# Patient Record
Sex: Female | Born: 1980
Health system: Southern US, Community
[De-identification: ages and names within clinical notes are randomized; demographics above are authoritative.]

## PROBLEM LIST (undated history)

## (undated) ENCOUNTER — Inpatient Hospital Stay (HOSPITAL_COMMUNITY): Payer: Self-pay

## (undated) DIAGNOSIS — Z5189 Encounter for other specified aftercare: Secondary | ICD-10-CM

## (undated) DIAGNOSIS — IMO0001 Reserved for inherently not codable concepts without codable children: Secondary | ICD-10-CM

## (undated) DIAGNOSIS — K219 Gastro-esophageal reflux disease without esophagitis: Secondary | ICD-10-CM

## (undated) DIAGNOSIS — R760 Raised antibody titer: Secondary | ICD-10-CM

## (undated) DIAGNOSIS — K5090 Crohn's disease, unspecified, without complications: Secondary | ICD-10-CM

## (undated) DIAGNOSIS — N200 Calculus of kidney: Secondary | ICD-10-CM

## (undated) HISTORY — PX: NASAL ENDOSCOPY: SHX286

## (undated) HISTORY — PX: COLONOSCOPY: SHX174

## (undated) HISTORY — PX: WISDOM TOOTH EXTRACTION: SHX21

---

## 1999-05-25 ENCOUNTER — Emergency Department (HOSPITAL_COMMUNITY): Admission: EM | Admit: 1999-05-25 | Discharge: 1999-05-25 | Payer: Self-pay | Admitting: Emergency Medicine

## 1999-06-15 ENCOUNTER — Emergency Department (HOSPITAL_COMMUNITY): Admission: EM | Admit: 1999-06-15 | Discharge: 1999-06-15 | Payer: Self-pay | Admitting: Emergency Medicine

## 2000-06-03 ENCOUNTER — Emergency Department (HOSPITAL_COMMUNITY): Admission: EM | Admit: 2000-06-03 | Discharge: 2000-06-03 | Payer: Self-pay

## 2000-06-18 ENCOUNTER — Emergency Department (HOSPITAL_COMMUNITY): Admission: EM | Admit: 2000-06-18 | Discharge: 2000-06-18 | Payer: Self-pay | Admitting: Emergency Medicine

## 2001-03-04 ENCOUNTER — Emergency Department (HOSPITAL_COMMUNITY): Admission: EM | Admit: 2001-03-04 | Discharge: 2001-03-04 | Payer: Self-pay | Admitting: Emergency Medicine

## 2001-04-12 ENCOUNTER — Emergency Department (HOSPITAL_COMMUNITY): Admission: EM | Admit: 2001-04-12 | Discharge: 2001-04-12 | Payer: Self-pay | Admitting: Emergency Medicine

## 2001-11-04 ENCOUNTER — Inpatient Hospital Stay (HOSPITAL_COMMUNITY): Admission: AD | Admit: 2001-11-04 | Discharge: 2001-11-04 | Payer: Self-pay | Admitting: Obstetrics

## 2001-11-08 ENCOUNTER — Inpatient Hospital Stay (HOSPITAL_COMMUNITY): Admission: AD | Admit: 2001-11-08 | Discharge: 2001-11-08 | Payer: Self-pay | Admitting: Obstetrics

## 2001-11-15 ENCOUNTER — Inpatient Hospital Stay (HOSPITAL_COMMUNITY): Admission: AD | Admit: 2001-11-15 | Discharge: 2001-11-15 | Payer: Self-pay | Admitting: Obstetrics

## 2001-11-16 ENCOUNTER — Inpatient Hospital Stay (HOSPITAL_COMMUNITY): Admission: AD | Admit: 2001-11-16 | Discharge: 2001-11-20 | Payer: Self-pay | Admitting: Obstetrics

## 2002-08-18 ENCOUNTER — Emergency Department (HOSPITAL_COMMUNITY): Admission: EM | Admit: 2002-08-18 | Discharge: 2002-08-18 | Payer: Self-pay | Admitting: Emergency Medicine

## 2002-08-18 ENCOUNTER — Encounter: Payer: Self-pay | Admitting: Emergency Medicine

## 2002-08-20 ENCOUNTER — Ambulatory Visit (HOSPITAL_COMMUNITY): Admission: RE | Admit: 2002-08-20 | Discharge: 2002-08-20 | Payer: Self-pay | Admitting: Gastroenterology

## 2002-10-21 ENCOUNTER — Encounter: Payer: Self-pay | Admitting: Gastroenterology

## 2002-10-22 ENCOUNTER — Inpatient Hospital Stay (HOSPITAL_COMMUNITY): Admission: EM | Admit: 2002-10-22 | Discharge: 2002-10-27 | Payer: Self-pay | Admitting: Gastroenterology

## 2002-10-28 ENCOUNTER — Encounter: Payer: Self-pay | Admitting: Emergency Medicine

## 2002-10-28 ENCOUNTER — Emergency Department (HOSPITAL_COMMUNITY): Admission: EM | Admit: 2002-10-28 | Discharge: 2002-10-28 | Payer: Self-pay | Admitting: Emergency Medicine

## 2002-11-02 ENCOUNTER — Encounter (HOSPITAL_COMMUNITY): Admission: RE | Admit: 2002-11-02 | Discharge: 2002-11-02 | Payer: Self-pay | Admitting: Gastroenterology

## 2003-01-05 ENCOUNTER — Ambulatory Visit (HOSPITAL_COMMUNITY): Admission: RE | Admit: 2003-01-05 | Discharge: 2003-01-05 | Payer: Self-pay | Admitting: Gastroenterology

## 2003-12-18 HISTORY — PX: LITHOTRIPSY: SUR834

## 2004-06-29 ENCOUNTER — Emergency Department (HOSPITAL_COMMUNITY): Admission: EM | Admit: 2004-06-29 | Discharge: 2004-06-29 | Payer: Self-pay | Admitting: Emergency Medicine

## 2004-06-29 ENCOUNTER — Emergency Department (HOSPITAL_COMMUNITY): Admission: EM | Admit: 2004-06-29 | Discharge: 2004-06-30 | Payer: Self-pay | Admitting: Emergency Medicine

## 2004-08-24 ENCOUNTER — Emergency Department (HOSPITAL_COMMUNITY): Admission: EM | Admit: 2004-08-24 | Discharge: 2004-08-24 | Payer: Self-pay

## 2005-01-01 IMAGING — CR DG CHEST 2V
2 series · 2 of 2 positions shown · non-contrast
Comparison: none

CLINICAL DATA: Asthma.  Shortness of breath. Wheezing.
 TWO VIEW CHEST RADIOGRAPH 
 Comparing 10/28/02.

[view not recorded (1 of 2)]
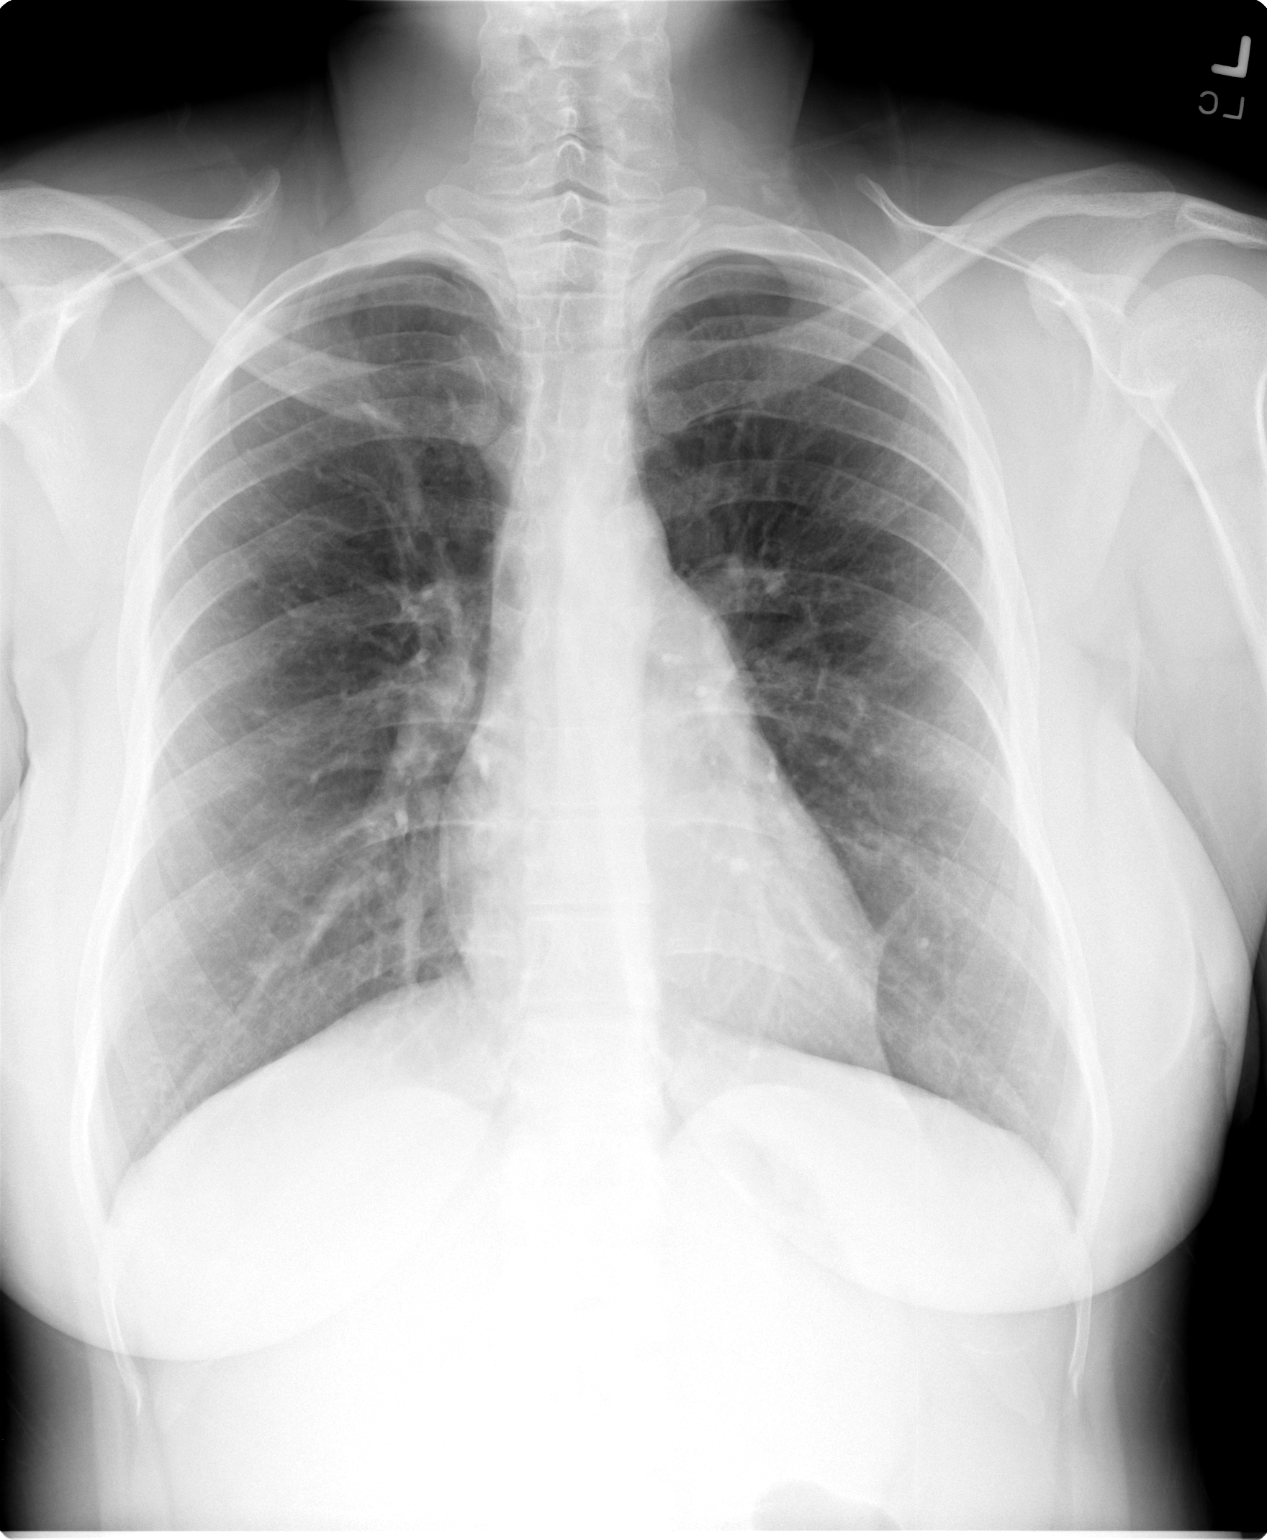

[view not recorded (2 of 2)]
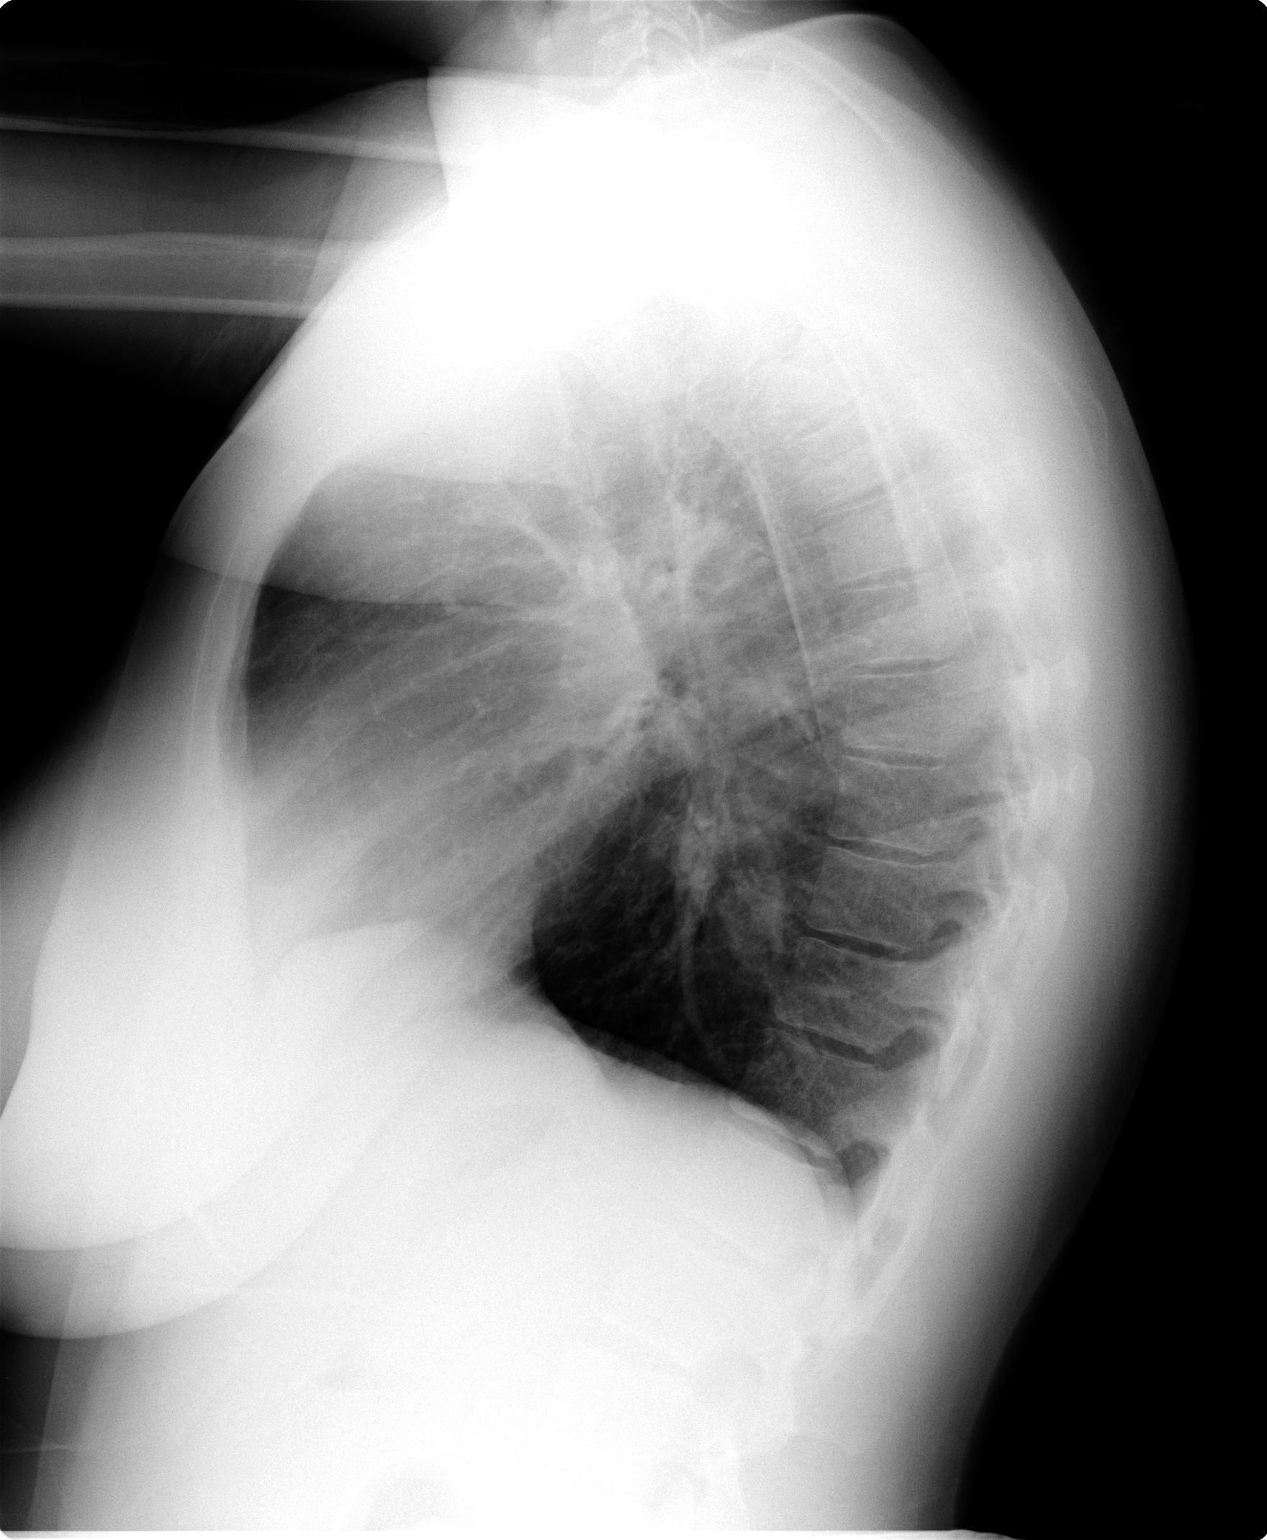

[2 of 2 positions shown; findings below may reference images not displayed]

FINDINGS: The heart size and mediastinal contours are normal. The lungs are clear. The visualized skeleton is unremarkable.

 IMPRESSION
 No active disease.

## 2006-04-18 ENCOUNTER — Inpatient Hospital Stay (HOSPITAL_COMMUNITY): Admission: RE | Admit: 2006-04-18 | Discharge: 2006-04-21 | Payer: Self-pay | Admitting: Obstetrics & Gynecology

## 2006-06-01 ENCOUNTER — Inpatient Hospital Stay (HOSPITAL_COMMUNITY): Admission: EM | Admit: 2006-06-01 | Discharge: 2006-06-02 | Payer: Self-pay | Admitting: Emergency Medicine

## 2007-03-19 ENCOUNTER — Emergency Department (HOSPITAL_COMMUNITY): Admission: EM | Admit: 2007-03-19 | Discharge: 2007-03-20 | Payer: Self-pay | Admitting: Emergency Medicine

## 2011-03-29 ENCOUNTER — Inpatient Hospital Stay (HOSPITAL_COMMUNITY)
Admission: AD | Admit: 2011-03-29 | Discharge: 2011-03-30 | Disposition: A | Discharge status: Home / Self Care | Payer: Medicaid Other | Source: Ambulatory Visit | Attending: Obstetrics & Gynecology | Admitting: Obstetrics & Gynecology

## 2011-03-29 DIAGNOSIS — O209 Hemorrhage in early pregnancy, unspecified: Secondary | ICD-10-CM

## 2011-03-30 ENCOUNTER — Inpatient Hospital Stay (HOSPITAL_COMMUNITY): Payer: Medicaid Other

## 2011-03-30 LAB — WET PREP, GENITAL: Yeast Wet Prep HPF POC: NONE SEEN

## 2011-03-30 LAB — CBC
Hemoglobin: 12 g/dL (ref 12.0–15.0)
MCH: 30.6 pg (ref 26.0–34.0)
RBC: 3.92 MIL/uL (ref 3.87–5.11)

## 2011-03-30 LAB — ABO/RH: ABO/RH(D): O POS

## 2011-03-30 LAB — URINALYSIS, ROUTINE W REFLEX MICROSCOPIC
Protein, ur: NEGATIVE mg/dL
Urobilinogen, UA: 0.2 mg/dL (ref 0.0–1.0)

## 2011-03-30 LAB — POCT PREGNANCY, URINE

## 2011-03-30 LAB — URINE MICROSCOPIC-ADD ON

## 2011-05-03 ENCOUNTER — Inpatient Hospital Stay (HOSPITAL_COMMUNITY)
Admission: AD | Admit: 2011-05-03 | Discharge: 2011-05-04 | Disposition: A | Discharge status: Home / Self Care | Payer: Self-pay | Source: Ambulatory Visit | Attending: Obstetrics & Gynecology | Admitting: Obstetrics & Gynecology

## 2011-05-03 DIAGNOSIS — J45909 Unspecified asthma, uncomplicated: Secondary | ICD-10-CM | POA: Insufficient documentation

## 2011-05-03 DIAGNOSIS — O99891 Other specified diseases and conditions complicating pregnancy: Secondary | ICD-10-CM | POA: Insufficient documentation

## 2011-05-03 DIAGNOSIS — O9989 Other specified diseases and conditions complicating pregnancy, childbirth and the puerperium: Secondary | ICD-10-CM

## 2011-06-01 ENCOUNTER — Emergency Department (HOSPITAL_COMMUNITY)
Admission: EM | Admit: 2011-06-01 | Discharge: 2011-06-01 | Disposition: A | Discharge status: Home / Self Care | Payer: Medicaid Other | Attending: Emergency Medicine | Admitting: Emergency Medicine

## 2011-06-01 DIAGNOSIS — K029 Dental caries, unspecified: Secondary | ICD-10-CM | POA: Insufficient documentation

## 2011-06-01 DIAGNOSIS — O99891 Other specified diseases and conditions complicating pregnancy: Secondary | ICD-10-CM | POA: Insufficient documentation

## 2011-06-01 DIAGNOSIS — K089 Disorder of teeth and supporting structures, unspecified: Secondary | ICD-10-CM | POA: Insufficient documentation

## 2011-06-12 LAB — HEPATITIS B SURFACE ANTIGEN: Hepatitis B Surface Ag: NEGATIVE

## 2011-06-12 LAB — RUBELLA ANTIBODY, IGM: Rubella: IMMUNE

## 2011-07-23 LAB — RPR: RPR: NONREACTIVE

## 2011-09-24 LAB — STREP B DNA PROBE: GBS: NEGATIVE

## 2011-10-04 ENCOUNTER — Other Ambulatory Visit: Payer: Self-pay | Admitting: Obstetrics & Gynecology

## 2011-10-12 NOTE — Patient Instructions (Addendum)
   Your procedure is scheduled on: Friday November 2nd  Enter through the Main Entrance of [facility] at:1:30pm Pick up the phone at the desk and dial [phone] and inform us of your arrival.  Please call this number if you have any problems the morning of surgery: [phone]  Remember: Do not eat food after midnight:Thursday Do not drink clear liquids after Friday at 11 am Take these medicines the morning of surgery with a SIP OF WATER: bring inhaler with you day of surgery, asacol  Do not wear jewelry, make-up, or FINGER nail polish Do not wear lotions, powders, or perfumes.  You may not wear deodorant. Do not shave 48 hours prior to surgery. Do not bring valuables to the hospital.  Leave suitcase in the car. After Surgery it may be brought to your room. For patients being admitted to the hospital, checkout time is 11:00am the day of discharge.    Remember to use your hibiclens as instructed.Please shower with 1/2 bottle the evening before your surgery and the other 1/2 bottle the morning of surgery.

## 2011-10-15 ENCOUNTER — Encounter (HOSPITAL_COMMUNITY): Payer: Self-pay

## 2011-10-16 ENCOUNTER — Encounter (HOSPITAL_COMMUNITY)
Admission: RE | Admit: 2011-10-16 | Discharge: 2011-10-16 | Disposition: A | Discharge status: Home / Self Care | Payer: Medicaid Other | Source: Ambulatory Visit | Attending: Obstetrics & Gynecology | Admitting: Obstetrics & Gynecology

## 2011-10-16 ENCOUNTER — Encounter (HOSPITAL_COMMUNITY): Payer: Self-pay

## 2011-10-16 HISTORY — DX: Gastro-esophageal reflux disease without esophagitis: K21.9

## 2011-10-16 HISTORY — DX: Encounter for other specified aftercare: Z51.89

## 2011-10-16 HISTORY — DX: Raised antibody titer: R76.0

## 2011-10-16 HISTORY — DX: Crohn's disease, unspecified, without complications: K50.90

## 2011-10-16 HISTORY — DX: Reserved for inherently not codable concepts without codable children: IMO0001

## 2011-10-16 LAB — CBC
Platelets: 130 10*3/uL — ABNORMAL LOW (ref 150–400)
RDW: 13.6 % (ref 11.5–15.5)
WBC: 11.6 10*3/uL — ABNORMAL HIGH (ref 4.0–10.5)

## 2011-10-16 LAB — TYPE AND SCREEN
ABO/RH(D): O POS
Antibody Screen: POSITIVE
PT AG Type: NEGATIVE

## 2011-10-16 NOTE — Pre-Procedure Instructions (Signed)
Platelets 130 resulted at pat visit 10/16/11-Dr Hatchett and Dr Malen Gauze aware -no orders

## 2011-10-16 NOTE — Pre-Procedure Instructions (Signed)
Dr Arby Barrette made aware of Antibody positive kell, type and screen today at pat visit Type and screen stat [facility]

## 2011-10-17 ENCOUNTER — Other Ambulatory Visit (HOSPITAL_COMMUNITY): Payer: Medicaid Other

## 2011-10-18 MED ORDER — CEFAZOLIN SODIUM-DEXTROSE 2-3 GM-% IV SOLR
2.0000 g | INTRAVENOUS | Status: AC
  Administered 2011-10-19: 2 g via INTRAVENOUS
  Filled 2011-10-18: qty 50

## 2011-10-19 ENCOUNTER — Encounter (HOSPITAL_COMMUNITY): Payer: Self-pay | Admitting: Anesthesiology

## 2011-10-19 ENCOUNTER — Encounter (HOSPITAL_COMMUNITY): Payer: Self-pay | Admitting: Obstetrics & Gynecology

## 2011-10-19 ENCOUNTER — Encounter (HOSPITAL_COMMUNITY): Payer: Self-pay | Admitting: *Deleted

## 2011-10-19 ENCOUNTER — Encounter (HOSPITAL_COMMUNITY)
Admission: RE | Disposition: A | Discharge status: Home / Self Care | Payer: Self-pay | Source: Ambulatory Visit | Attending: Obstetrics & Gynecology

## 2011-10-19 ENCOUNTER — Inpatient Hospital Stay (HOSPITAL_COMMUNITY): Payer: Medicaid Other | Admitting: Anesthesiology

## 2011-10-19 ENCOUNTER — Inpatient Hospital Stay (HOSPITAL_COMMUNITY)
Admission: RE | Admit: 2011-10-19 | Discharge: 2011-10-22 | DRG: 766 | Disposition: A | Discharge status: Home / Self Care | Payer: Medicaid Other | Source: Ambulatory Visit | Attending: Obstetrics & Gynecology | Admitting: Obstetrics & Gynecology

## 2011-10-19 DIAGNOSIS — O34219 Maternal care for unspecified type scar from previous cesarean delivery: Principal | ICD-10-CM | POA: Diagnosis present

## 2011-10-19 DIAGNOSIS — Z01812 Encounter for preprocedural laboratory examination: Secondary | ICD-10-CM

## 2011-10-19 DIAGNOSIS — Z01818 Encounter for other preprocedural examination: Secondary | ICD-10-CM

## 2011-10-19 LAB — TYPE AND SCREEN
Antibody Screen: POSITIVE
DAT, IgG: NEGATIVE

## 2011-10-19 SURGERY — Surgical Case
Anesthesia: Regional | Site: Uterus | Wound class: Clean Contaminated

## 2011-10-19 MED ORDER — PHENYLEPHRINE 40 MCG/ML (10ML) SYRINGE FOR IV PUSH (FOR BLOOD PRESSURE SUPPORT)
PREFILLED_SYRINGE | INTRAVENOUS | Status: AC
  Filled 2011-10-19: qty 5

## 2011-10-19 MED ORDER — SCOPOLAMINE 1 MG/3DAYS TD PT72
MEDICATED_PATCH | TRANSDERMAL | Status: AC
  Administered 2011-10-19: 1.5 mg via TRANSDERMAL
  Filled 2011-10-19: qty 1

## 2011-10-19 MED ORDER — DIPHENHYDRAMINE HCL 25 MG PO CAPS
25.0000 mg | ORAL_CAPSULE | ORAL | Status: DC | PRN
  Filled 2011-10-19: qty 1

## 2011-10-19 MED ORDER — ACETAMINOPHEN 500 MG PO TABS
1000.0000 mg | ORAL_TABLET | Freq: Four times a day (QID) | ORAL | Status: DC | PRN
  Administered 2011-10-19: 1000 mg via ORAL
  Filled 2011-10-19: qty 2

## 2011-10-19 MED ORDER — SODIUM CHLORIDE 0.9 % IJ SOLN: 3.0000 mL | INTRAMUSCULAR | Status: DC | PRN

## 2011-10-19 MED ORDER — ONDANSETRON HCL 4 MG/2ML IJ SOLN
INTRAMUSCULAR | Status: AC
  Filled 2011-10-19: qty 2

## 2011-10-19 MED ORDER — OXYCODONE-ACETAMINOPHEN 5-325 MG PO TABS
1.0000 | ORAL_TABLET | ORAL | Status: DC | PRN
  Administered 2011-10-20 (×3): 1 via ORAL
  Administered 2011-10-20: 2 via ORAL
  Administered 2011-10-20 (×3): 1 via ORAL
  Administered 2011-10-21 (×3): 2 via ORAL
  Administered 2011-10-21 (×2): 1 via ORAL
  Administered 2011-10-21 – 2011-10-22 (×3): 2 via ORAL
  Administered 2011-10-22 (×2): 1 via ORAL
  Filled 2011-10-19: qty 1
  Filled 2011-10-19: qty 2
  Filled 2011-10-19 (×2): qty 1
  Filled 2011-10-19 (×3): qty 2
  Filled 2011-10-19: qty 1
  Filled 2011-10-19 (×2): qty 2
  Filled 2011-10-19: qty 1
  Filled 2011-10-19: qty 2
  Filled 2011-10-19: qty 1
  Filled 2011-10-19: qty 2
  Filled 2011-10-19: qty 1
  Filled 2011-10-19: qty 2

## 2011-10-19 MED ORDER — FERROUS SULFATE 325 (65 FE) MG PO TABS
325.0000 mg | ORAL_TABLET | Freq: Two times a day (BID) | ORAL | Status: DC
  Administered 2011-10-20 – 2011-10-22 (×4): 325 mg via ORAL
  Filled 2011-10-19 (×4): qty 1

## 2011-10-19 MED ORDER — ALBUTEROL SULFATE HFA 108 (90 BASE) MCG/ACT IN AERS: 2.0000 | INHALATION_SPRAY | Freq: Four times a day (QID) | RESPIRATORY_TRACT | Status: DC | PRN

## 2011-10-19 MED ORDER — SCOPOLAMINE 1 MG/3DAYS TD PT72
1.0000 | MEDICATED_PATCH | Freq: Once | TRANSDERMAL | Status: DC
  Administered 2011-10-19: 1.5 mg via TRANSDERMAL

## 2011-10-19 MED ORDER — IBUPROFEN 600 MG PO TABS: 600.0000 mg | ORAL_TABLET | Freq: Four times a day (QID) | ORAL | Status: DC

## 2011-10-19 MED ORDER — MORPHINE SULFATE (PF) 0.5 MG/ML IJ SOLN
INTRAMUSCULAR | Status: DC | PRN
  Administered 2011-10-19: .15 mg via INTRATHECAL

## 2011-10-19 MED ORDER — ONDANSETRON HCL 4 MG/2ML IJ SOLN: 4.0000 mg | Freq: Three times a day (TID) | INTRAMUSCULAR | Status: DC | PRN

## 2011-10-19 MED ORDER — OXYTOCIN 20 UNITS IN LACTATED RINGERS INFUSION - SIMPLE: 125.0000 mL/h | INTRAVENOUS | Status: AC

## 2011-10-19 MED ORDER — DIPHENHYDRAMINE HCL 50 MG/ML IJ SOLN
12.5000 mg | INTRAMUSCULAR | Status: DC | PRN
  Administered 2011-10-19: 12.5 mg via INTRAVENOUS

## 2011-10-19 MED ORDER — OXYTOCIN 20 UNITS IN LACTATED RINGERS INFUSION - SIMPLE
INTRAVENOUS | Status: AC
  Administered 2011-10-19: 20 [IU]
  Filled 2011-10-19: qty 1000

## 2011-10-19 MED ORDER — FENTANYL CITRATE 0.05 MG/ML IJ SOLN: 25.0000 ug | INTRAMUSCULAR | Status: DC | PRN

## 2011-10-19 MED ORDER — DIPHENHYDRAMINE HCL 50 MG/ML IJ SOLN: 25.0000 mg | INTRAMUSCULAR | Status: DC | PRN

## 2011-10-19 MED ORDER — LANOLIN HYDROUS EX OINT: 1.0000 "application " | TOPICAL_OINTMENT | CUTANEOUS | Status: DC | PRN

## 2011-10-19 MED ORDER — ALBUTEROL SULFATE HFA 108 (90 BASE) MCG/ACT IN AERS
INHALATION_SPRAY | RESPIRATORY_TRACT | Status: AC
  Filled 2011-10-19: qty 6.7

## 2011-10-19 MED ORDER — LACTATED RINGERS IV SOLN
INTRAVENOUS | Status: DC
  Administered 2011-10-19: 15:00:00 via INTRAVENOUS

## 2011-10-19 MED ORDER — PHENYLEPHRINE HCL 10 MG/ML IJ SOLN
INTRAMUSCULAR | Status: DC | PRN
  Administered 2011-10-19 (×5): 40 ug via INTRAVENOUS

## 2011-10-19 MED ORDER — MORPHINE SULFATE 0.5 MG/ML IJ SOLN
INTRAMUSCULAR | Status: AC
  Filled 2011-10-19: qty 10

## 2011-10-19 MED ORDER — MORPHINE SULFATE (PF) 0.5 MG/ML IJ SOLN
INTRAMUSCULAR | Status: DC | PRN
  Administered 2011-10-19: 4.85 mg via INTRAVENOUS

## 2011-10-19 MED ORDER — METOCLOPRAMIDE HCL 5 MG/ML IJ SOLN
INTRAMUSCULAR | Status: AC
  Administered 2011-10-19: 10 mg via INTRAVENOUS
  Filled 2011-10-19: qty 2

## 2011-10-19 MED ORDER — DIPHENHYDRAMINE HCL 50 MG/ML IJ SOLN
INTRAMUSCULAR | Status: AC
  Filled 2011-10-19: qty 1

## 2011-10-19 MED ORDER — FENTANYL CITRATE 0.05 MG/ML IJ SOLN
INTRAMUSCULAR | Status: AC
  Filled 2011-10-19: qty 2

## 2011-10-19 MED ORDER — NALOXONE HCL 0.4 MG/ML IJ SOLN: 0.4000 mg | INTRAMUSCULAR | Status: DC | PRN

## 2011-10-19 MED ORDER — LACTATED RINGERS IV SOLN
INTRAVENOUS | Status: DC
  Administered 2011-10-20: 01:00:00 via INTRAVENOUS

## 2011-10-19 MED ORDER — ZOLPIDEM TARTRATE 5 MG PO TABS: 5.0000 mg | ORAL_TABLET | Freq: Every evening | ORAL | Status: DC | PRN

## 2011-10-19 MED ORDER — NALBUPHINE HCL 10 MG/ML IJ SOLN: 5.0000 mg | INTRAMUSCULAR | Status: DC | PRN

## 2011-10-19 MED ORDER — FENTANYL CITRATE 0.05 MG/ML IJ SOLN
INTRAMUSCULAR | Status: DC | PRN
  Administered 2011-10-19: 25 ug via INTRATHECAL

## 2011-10-19 MED ORDER — MAGNESIUM HYDROXIDE 400 MG/5ML PO SUSP: 30.0000 mL | ORAL | Status: DC | PRN

## 2011-10-19 MED ORDER — MEDROXYPROGESTERONE ACETATE 150 MG/ML IM SUSP: 150.0000 mg | INTRAMUSCULAR | Status: DC | PRN

## 2011-10-19 MED ORDER — BUPIVACAINE IN DEXTROSE 0.75-8.25 % IT SOLN
INTRATHECAL | Status: DC | PRN
  Administered 2011-10-19: 14 mg via INTRATHECAL

## 2011-10-19 MED ORDER — WITCH HAZEL-GLYCERIN EX PADS: 1.0000 "application " | MEDICATED_PAD | CUTANEOUS | Status: DC | PRN

## 2011-10-19 MED ORDER — OXYTOCIN 20 UNITS IN LACTATED RINGERS INFUSION - SIMPLE
INTRAVENOUS | Status: DC | PRN
  Administered 2011-10-19: 20 [IU] via INTRAVENOUS

## 2011-10-19 MED ORDER — LACTATED RINGERS IV SOLN
INTRAVENOUS | Status: DC
  Administered 2011-10-19 (×3): via INTRAVENOUS

## 2011-10-19 MED ORDER — OXYTOCIN 10 UNIT/ML IJ SOLN
INTRAMUSCULAR | Status: AC
  Filled 2011-10-19: qty 4

## 2011-10-19 MED ORDER — MEPERIDINE HCL 25 MG/ML IJ SOLN: 6.2500 mg | INTRAMUSCULAR | Status: DC | PRN

## 2011-10-19 MED ORDER — PRENATAL PLUS 27-1 MG PO TABS
1.0000 | ORAL_TABLET | Freq: Every day | ORAL | Status: DC
  Administered 2011-10-20 – 2011-10-22 (×2): 1 via ORAL
  Filled 2011-10-19 (×2): qty 1

## 2011-10-19 MED ORDER — SODIUM CHLORIDE 0.9 % IV SOLN: 1.0000 ug/kg/h | INTRAVENOUS | Status: DC | PRN

## 2011-10-19 MED ORDER — ONDANSETRON HCL 4 MG/2ML IJ SOLN
4.0000 mg | INTRAMUSCULAR | Status: DC | PRN
  Administered 2011-10-19: 4 mg via INTRAVENOUS
  Filled 2011-10-19: qty 2

## 2011-10-19 MED ORDER — SCOPOLAMINE 1 MG/3DAYS TD PT72
MEDICATED_PATCH | TRANSDERMAL | Status: AC
  Filled 2011-10-19: qty 1

## 2011-10-19 MED ORDER — ALBUTEROL SULFATE HFA 108 (90 BASE) MCG/ACT IN AERS: 1.0000 | INHALATION_SPRAY | Freq: Four times a day (QID) | RESPIRATORY_TRACT | Status: DC | PRN

## 2011-10-19 MED ORDER — FENTANYL CITRATE 0.05 MG/ML IJ SOLN
INTRAMUSCULAR | Status: DC | PRN
  Administered 2011-10-19: 75 ug via INTRAVENOUS

## 2011-10-19 MED ORDER — METOCLOPRAMIDE HCL 5 MG/ML IJ SOLN: 10.0000 mg | Freq: Three times a day (TID) | INTRAMUSCULAR | Status: DC | PRN

## 2011-10-19 MED ORDER — ONDANSETRON HCL 4 MG PO TABS: 4.0000 mg | ORAL_TABLET | ORAL | Status: DC | PRN

## 2011-10-19 MED ORDER — ONDANSETRON HCL 4 MG/2ML IJ SOLN
INTRAMUSCULAR | Status: DC | PRN
  Administered 2011-10-19: 4 mg via INTRAVENOUS

## 2011-10-19 MED ORDER — SIMETHICONE 80 MG PO CHEW
80.0000 mg | CHEWABLE_TABLET | ORAL | Status: DC | PRN
  Administered 2011-10-20 – 2011-10-21 (×3): 80 mg via ORAL

## 2011-10-19 MED ORDER — DIPHENHYDRAMINE HCL 25 MG PO CAPS: 25.0000 mg | ORAL_CAPSULE | Freq: Four times a day (QID) | ORAL | Status: DC | PRN

## 2011-10-19 MED ORDER — MESALAMINE 400 MG PO TBEC
800.0000 mg | DELAYED_RELEASE_TABLET | Freq: Every day | ORAL | Status: DC
  Administered 2011-10-20 – 2011-10-22 (×3): 800 mg via ORAL
  Filled 2011-10-19 (×4): qty 2

## 2011-10-19 MED ORDER — MEASLES, MUMPS & RUBELLA VAC ~~LOC~~ INJ: 0.5000 mL | INJECTION | Freq: Once | SUBCUTANEOUS | Status: DC

## 2011-10-19 MED ORDER — METOCLOPRAMIDE HCL 5 MG/ML IJ SOLN
10.0000 mg | Freq: Once | INTRAMUSCULAR | Status: AC | PRN
  Administered 2011-10-19: 10 mg via INTRAVENOUS

## 2011-10-19 MED ORDER — SENNOSIDES-DOCUSATE SODIUM 8.6-50 MG PO TABS
2.0000 | ORAL_TABLET | Freq: Every day | ORAL | Status: DC
  Administered 2011-10-20 – 2011-10-21 (×2): 2 via ORAL

## 2011-10-19 MED ORDER — TETANUS-DIPHTH-ACELL PERTUSSIS 5-2.5-18.5 LF-MCG/0.5 IM SUSP
0.5000 mL | Freq: Once | INTRAMUSCULAR | Status: AC
  Administered 2011-10-21: 0.5 mL via INTRAMUSCULAR
  Filled 2011-10-19: qty 0.5

## 2011-10-19 MED ORDER — DIBUCAINE 1 % RE OINT: 1.0000 "application " | TOPICAL_OINTMENT | RECTAL | Status: DC | PRN

## 2011-10-19 SURGICAL SUPPLY — 27 items
ADH SKN CLS APL DERMABOND .7 (GAUZE/BANDAGES/DRESSINGS) ×1
APL SKNCLS STERI-STRIP NONHPOA (GAUZE/BANDAGES/DRESSINGS) ×1
BENZOIN TINCTURE PRP APPL 2/3 (GAUZE/BANDAGES/DRESSINGS) ×1 IMPLANT
CHLORAPREP W/TINT 26ML (MISCELLANEOUS) ×2 IMPLANT
CLOTH BEACON ORANGE TIMEOUT ST (SAFETY) ×2 IMPLANT
DERMABOND ADVANCED (GAUZE/BANDAGES/DRESSINGS) ×1
DERMABOND ADVANCED .7 DNX12 (GAUZE/BANDAGES/DRESSINGS) ×1 IMPLANT
DRESSING TELFA 8X3 (GAUZE/BANDAGES/DRESSINGS) ×2 IMPLANT
ELECT REM PT RETURN 9FT ADLT (ELECTROSURGICAL) ×2
ELECTRODE REM PT RTRN 9FT ADLT (ELECTROSURGICAL) ×1 IMPLANT
EXTRACTOR VACUUM M CUP 4 TUBE (SUCTIONS) IMPLANT
GAUZE SPONGE 4X4 12PLY STRL LF (GAUZE/BANDAGES/DRESSINGS) ×3 IMPLANT
GLOVE BIO SURGEON STRL SZ 6.5 (GLOVE) ×4 IMPLANT
GOWN PREVENTION PLUS LG XLONG (DISPOSABLE) ×6 IMPLANT
NS IRRIG 1000ML POUR BTL (IV SOLUTION) ×2 IMPLANT
PACK C SECTION WH (CUSTOM PROCEDURE TRAY) ×2 IMPLANT
PAD ABD 7.5X8 STRL (GAUZE/BANDAGES/DRESSINGS) ×2 IMPLANT
STRIP CLOSURE SKIN 1/2X4 (GAUZE/BANDAGES/DRESSINGS) ×1 IMPLANT
SUT MNCRL 0 VIOLET CTX 36 (SUTURE) ×2 IMPLANT
SUT MNCRL AB 3-0 PS2 27 (SUTURE) ×1 IMPLANT
SUT MONOCRYL 0 CTX 36 (SUTURE) ×2
SUT PDS AB 0 CT1 27 (SUTURE) ×2 IMPLANT
SUT VIC AB 2-0 CT1 27 (SUTURE) ×2
SUT VIC AB 2-0 CT1 TAPERPNT 27 (SUTURE) ×1 IMPLANT
TOWEL OR 17X24 6PK STRL BLUE (TOWEL DISPOSABLE) ×4 IMPLANT
TRAY FOLEY CATH 14FR (SET/KITS/TRAYS/PACK) ×2 IMPLANT
WATER STERILE IRR 1000ML POUR (IV SOLUTION) ×2 IMPLANT

## 2011-10-19 NOTE — Anesthesia Preprocedure Evaluation (Addendum)
Anesthesia Evaluation  Patient identified by MRN, date of birth, ID band Patient awake    Reviewed: Allergy & Precautions, H&P , NPO status , Patient's Chart, lab work & pertinent test results  Airway Mallampati: III TM Distance: >3 FB Neck ROM: Full    Dental No notable dental hx. (+) Teeth Intact   Pulmonary neg pulmonary ROS, asthma ,  clear to auscultation  Pulmonary exam normal       Cardiovascular neg cardio ROS Regular Normal    Neuro/Psych Negative Neurological ROS  Negative Psych ROS   GI/Hepatic negative GI ROS, Neg liver ROS, GERD-  Medicated and Controlled,Crohn's Disease   Endo/Other  Negative Endocrine ROS  Renal/GU negative Renal ROS  Genitourinary negative   Musculoskeletal   Abdominal   Peds  Hematology negative hematology ROS (+) Anti Kell antibody   Anesthesia Other Findings   Reproductive/Obstetrics (+) Pregnancy                         Anesthesia Physical Anesthesia Plan  ASA: III  Anesthesia Plan: Spinal   Post-op Pain Management:    Induction:   Airway Management Planned:   Additional Equipment:   Intra-op Plan:   Post-operative Plan:   Informed Consent: I have reviewed the patients History and Physical, chart, labs and discussed the procedure including the risks, benefits and alternatives for the proposed anesthesia with the patient or authorized representative who has indicated his/her understanding and acceptance.     Plan Discussed with: Anesthesiologist, CRNA and Surgeon  Anesthesia Plan Comments:         Anesthesia Quick Evaluation

## 2011-10-19 NOTE — Op Note (Signed)
Cesarean Section Procedure Note   Jamie Wilkinson   10/19/2011  Indications: Scheduled Proceedure/Maternal Request   Pre-operative Diagnosis: Previous Cesarean Section.   Post-operative Diagnosis: Same   Surgeon: Roseanna Rainbow  Assistants: Francoise Ceo  Anesthesia: spinal  Procedure Details:  The patient was seen in the Holding Room. The risks, benefits, complications, treatment options, and expected outcomes were discussed with the patient. The patient concurred with the proposed plan, giving informed consent. The patient was identified as [name] and the procedure verified as C-Section Delivery. A Time Out was held and the above information confirmed.  After induction of anesthesia, the patient was draped and prepped in the usual sterile manner. A transverse incision was made and carried down through the subcutaneous tissue to the fascia. The fascial incision was made and extended transversely. The fascia was separated from the underlying rectus tissue superiorly and inferiorly. The peritoneum was identified and entered. The peritoneal incision was extended longitudinally. The utero-vesical peritoneal reflection was incised transversely and the bladder flap was bluntly freed from the lower uterine segment. A low transverse uterine incision was made. Delivered from cephalic presentation was a  living newborn female infant.  A cord ph was not sent. The umbilical cord was clamped and cut cord. A sample was obtained for evaluation. The placenta was removed Intact and appeared normal.  The uterine incision was closed with running locked sutures of 1-0 Monocryl. A second imbricating layer of the same suture was placed.  Hemostasis was observed. The paracolic gutters were irrigated. The fascia was then reapproximated with running sutures of 1-0Vicryl. The subcuticular closure was performed using 3-0 Monocryl.  Instrument, sponge, and needle counts were correct prior the  abdominal closure and were correct at the conclusion of the case.    Findings:   Estimated Blood Loss: 600 ml  Total IV Fluids: per Anesthesiology  Urine Output: per Anesthesiology  Specimens:  Specimens    None       Complications: no complications  Disposition: PACU - hemodynamically stable.  Maternal Condition: stable   Baby condition / location:  nursery-stable    Signed: Surgeon(s): Roseanna Rainbow, MD Kathreen Cosier, MD

## 2011-10-19 NOTE — Anesthesia Procedure Notes (Signed)
Spinal Block  Patient location during procedure: OR Start time: 10/19/2011 3:30 PM Staffing Anesthesiologist: Leshia Kope A. Performed by: anesthesiologist  Preanesthetic Checklist Completed: patient identified, site marked, surgical consent, pre-op evaluation, timeout performed, IV checked, risks and benefits discussed and monitors and equipment checked Spinal Block Patient position: sitting Prep: DuraPrep Patient monitoring: heart rate, cardiac monitor, continuous pulse ox and blood pressure Approach: midline Location: L3-4 Injection technique: single-shot Needle Needle type: Sprotte  Needle gauge: 24 G Needle length: 9 cm Needle insertion depth: 7 cm Assessment Sensory level: T4 Additional Notes Patient tolerated procedure well. Adequate sensory level.

## 2011-10-19 NOTE — Transfer of Care (Signed)
Immediate Anesthesia Transfer of Care Note  Patient: Jamie Wilkinson  Procedure(s) Performed:  CESAREAN SECTION  Patient Location: PACU  Anesthesia Type: Spinal  Level of Consciousness: alert  and oriented  Airway & Oxygen Therapy: Patient Spontanous Breathing  Post-op Assessment: Report given to PACU RN and Post -op Vital signs reviewed and stable  Post vital signs: stable  Complications: No apparent anesthesia complications

## 2011-10-19 NOTE — H&P (Signed)
Jamie Wilkinson is a 30 y.o. female presenting for a scheduled cesarean delivery. Maternal Medical History:  Reason for admission: Reason for Admission:   nauseaFor elective repeat c-section.  Fetal activity: Perceived fetal activity is normal.    Prenatal complications: no prenatal complications   OB History    Grav Para Term Preterm Abortions TAB SAB Ect Mult Living   4 2 2  1 1    2      Past Medical History  Diagnosis Date  . Asthma     uses rescue inhaler as need-will bring dos  . GERD (gastroesophageal reflux disease)     only with pregnancy-uses tums prn  . Crohn's disease   . Abnormal antibody titer     Positive Antibody KELL  . Blood transfusion     at [facility]   Past Surgical History  Procedure Date  . Cesarean section   . Nasal endoscopy   . Lithotripsy 2005  . Colonoscopy    Family History: family history is not on file. Social History:  reports that she quit smoking about 7 months ago. Her smoking use included Cigarettes. She quit after 7 years of use. She does not have any smokeless tobacco history on file. She reports that she does not drink alcohol or use illicit drugs.  Review of Systems  Constitutional: Negative for fever.  Eyes: Negative for blurred vision.  Respiratory: Negative for shortness of breath.   Gastrointestinal: Negative for nausea and vomiting.  Skin: Negative for rash.  Neurological: Negative for headaches.      Last menstrual period 01/19/2011. Maternal Exam:  Abdomen: Patient reports no abdominal tenderness. Surgical scars: low transverse.   Introitus: not evaluated.     Fetal Exam Fetal Monitor Review: Mode: hand-held doppler probe.       Physical Exam  Constitutional: She appears well-developed.  HENT:  Head: Normocephalic.  Neck: Neck supple. No thyromegaly present.  Cardiovascular: Normal rate and regular rhythm.   Respiratory: Breath sounds normal.  GI: Soft. Bowel sounds are normal.  Skin: No rash noted.      Prenatal labs: ABO, Rh: --/--/O POS (10/30 1333) Antibody: POS (10/30 1333) Rubella: Immune (06/26 0000) RPR: NON REACTIVE (10/30 1333)  HBsAg: Negative (06/26 0000)  HIV: Non-reactive (08/27 0000)  GBS: Negative (10/08 0000)   Assessment/Plan: 30 y.o. with an IUP @ [redacted]w[redacted]d.  For an elective, repeat cesarean delivery.  Admit Repeat cesarean delivery   JACKSON-MOORE,Obe Ahlers A 10/19/2011, 12:08 PM

## 2011-10-19 NOTE — Anesthesia Postprocedure Evaluation (Signed)
  Anesthesia Post-op Note  Patient: Jamie Wilkinson  Procedure(s) Performed:  CESAREAN SECTION  Patient Location: PACU  Anesthesia Type: Spinal  Level of Consciousness: awake, alert  and oriented  Airway and Oxygen Therapy: Patient Spontanous Breathing  Post-op Pain: none  Post-op Assessment: Post-op Vital signs reviewed, Patient's Cardiovascular Status Stable, Respiratory Function Stable, Patent Airway, No signs of Nausea or vomiting, Pain level controlled, No headache, No backache, No residual numbness and No residual motor weakness  Post-op Vital Signs: Reviewed and stable  Complications: No apparent anesthesia complications

## 2011-10-20 LAB — HEMOGLOBIN: Hemoglobin: 11 g/dL — ABNORMAL LOW (ref 12.0–15.0)

## 2011-10-20 NOTE — Progress Notes (Signed)
  Subjective: POD# 1 s/p Cesarean Delivery.  Indications: elective  RH status/Rubella reviewed. Feeding: breast Patient reports tolerating PO.  Denies HA/SOB/C/P/N/V/dizziness.  Reports flatus or BM. Breast symptoms:  no She reports vaginal bleeding as normal, without clots.  She is ambulating, urinating without difficulty.     Objective: Vital signs in last 24 hours: BP 112/65  Pulse 92  Temp(Src) 98.1 F (36.7 C) (Oral)  Resp 20  Wt 92.647 kg (204 lb 4 oz)  SpO2 98%  LMP 01/19/2011  Breastfeeding? Unknown   Total I/O In: -  Out: 450 [Urine:450]   Physical Exam:  General: alert CV: Regular rate and rhythm Resp: clear Abdomen: soft, nontender, normal bowel sounds Lochia: minimal Uterine Fundus: firm, below umbilicus, nontender Incision: dressing C/D Ext: extremities normal, atraumatic, no cyanosis or edema    Basename 10/20/11 0534  HGB 11.0*  HCT --      Assessment/Plan: 29 y.o.  status post Cesarean section. POD# 1.   Doing well, stable.              Advance diet as tolerated Start po pain meds D/C foley  HLIV  Ambulate IS Routine post-op care  JACKSON-MOORE,Jaimeson Gopal A 10/20/2011, 11:32 AM

## 2011-10-20 NOTE — Anesthesia Postprocedure Evaluation (Signed)
  Anesthesia Post-op Note  Patient: Jamie Wilkinson  Procedure(s) Performed:  CESAREAN SECTION  Patient Location: Mother/Baby  Anesthesia Type: Spinal  Level of Consciousness: alert  and oriented  Airway and Oxygen Therapy: Patient Spontanous Breathing  Post-op Pain: mild  Post-op Assessment: Patient's Cardiovascular Status Stable and Respiratory Function Stable  Post-op Vital Signs: stable  Complications: No apparent anesthesia complications

## 2011-10-20 NOTE — Addendum Note (Signed)
Addendum  created 10/20/11 1610 by Fanny Dance   Modules edited:Notes Section

## 2011-10-21 NOTE — Progress Notes (Signed)
  Subjective: POD# 2 s/p Cesarean Delivery.  Indications: elective  RH status/Rubella reviewed. Feeding: breast Patient reports tolerating PO.  Denies HA/SOB/C/P/N/V/dizziness.  Reports flatus. Breast symptoms: no.  She reports vaginal bleeding as normal, without clots.  She is ambulating, urinating without difficulty.     Objective: Vital signs in last 24 hours: BP 107/73  Pulse 96  Temp(Src) 98.2 F (36.8 C) (Oral)  Resp 20  Wt 92.647 kg (204 lb 4 oz)  SpO2 98%  LMP 01/19/2011  Breastfeeding? Unknown       Physical Exam:  General: alert CV: Regular rate and rhythm Resp: clear Abdomen: soft, nontender, normal bowel sounds Lochia: minimal Uterine Fundus: firm, below umbilicus, nontender Incision: clean, dry and intact Ext: extremities normal, atraumatic, no cyanosis or edema    Basename 10/20/11 0534  HGB 11.0*  HCT --      Assessment/Plan: 30 y.o.  status post Cesarean section. POD# 2.   Doing well, stable.               Ambulate IS Routine post-op care  Jamie Wilkinson,Jamie Wilkinson 10/21/2011, 12:04 PM

## 2011-10-22 ENCOUNTER — Encounter (HOSPITAL_COMMUNITY): Payer: Self-pay | Admitting: Obstetrics & Gynecology

## 2011-10-22 MED ORDER — NORETHINDRONE ACET-ETHINYL EST 1-20 MG-MCG PO TABS: 1.0000 | ORAL_TABLET | Freq: Every day | ORAL | Status: DC

## 2011-10-22 MED ORDER — OXYCODONE-ACETAMINOPHEN 5-325 MG PO TABS: 1.0000 | ORAL_TABLET | ORAL | Status: AC | PRN

## 2011-10-22 NOTE — Progress Notes (Signed)
UR chart review completed.  

## 2011-10-22 NOTE — Progress Notes (Signed)
Subjective: POD #3 s/p LTC/S  Indication:Elective Patient reports tolerating PO.  Denies HA/SOB/C/P/N/V/dizziness.  Reports flatus or BM. Breast symptoms: no  She reports vaginal bleeding as normal, without clots.  She is ambulating, urinating without difficulty.     Objective: Vital signs in last 24 hours: BP 118/73  Pulse 79  Temp(Src) 98.2 F (36.8 C) (Oral)  Resp 20  Wt 92.647 kg (204 lb 4 oz)  SpO2 97%  LMP 01/19/2011  Breastfeeding? Unknown  Physical Exam:  General: alert CV: Regular rate and rhythm Resp: clear Abdomen: soft, nontender, normal bowel sounds Lochia: minimal Uterine Fundus: firm, below umbilicus, nontender Incision: clean, dry and intact Ext: extremities normal, atraumatic, no cyanosis or edema  Basename 10/20/11 0534  HGB 11.0*  HCT --    Assessment/Plan: 30 y.o. status post Cesarean section POD# 3.  normal post-operative exam patient is a candidate for oral contraceptives (estrogen/progesterone) for contraception, with no contraindications  Routine post-op care D/C home  JACKSON-MOORE,Gwynne Kemnitz A 10/22/2011, 12:33 PM

## 2011-10-22 NOTE — Discharge Summary (Signed)
Obstetric Discharge Summary Reason for Admission: cesarean section Prenatal Procedures: none Intrapartum Procedures: cesarean: low cervical, transverse Postpartum Procedures: none Complications-Operative and Postpartum: none Hemoglobin  Date Value Range Status  10/20/2011 11.0* 12.0-15.0 (g/dL) Final     HCT  Date Value Range Status  10/16/2011 34.8* 36.0-46.0 (%) Final    Discharge Diagnoses: Term Pregnancy-delivered  Discharge Information: Date: 10/22/2011 Activity: pelvic rest Diet: routine Medications:  Prior to Admission medications   Medication Sig Start Date End Date Taking? Authorizing Provider  albuterol (PROVENTIL HFA;VENTOLIN HFA) 108 (90 BASE) MCG/ACT inhaler Inhale 2 puffs into the lungs every 6 (six) hours as needed. Shortness of breath      Yes Historical Provider, MD  calcium carbonate (TUMS - DOSED IN MG ELEMENTAL CALCIUM) 500 MG chewable tablet Chew 2 tablets by mouth 2 (two) times daily as needed. heartburn    Yes Historical Provider, MD  Mesalamine (ASACOL HD) 800 MG TBEC Take 1 tablet by mouth daily.     Yes Historical Provider, MD  norethindrone-ethinyl estradiol (MICROGESTIN,JUNEL,LOESTRIN) 1-20 MG-MCG tablet Take 1 tablet by mouth daily. Start in 3 weeks 10/22/11 10/21/12  Roseanna Rainbow, MD  oxyCODONE-acetaminophen (PERCOCET) 5-325 MG per tablet Take 1-2 tablets by mouth every 3 (three) hours as needed (moderate - severe pain). 10/22/11 11/01/11  Roseanna Rainbow, MD   Condition: stable Instructions: See above Discharge to: home Follow-up Information    Please follow up. (keep previous appointment)          Newborn Data: Live born female  Birth Weight: 6 lb 6.8 oz (2915 g) APGAR: 8, 9  Home with mother.  Wilkinson,Jamie Harriss A 10/22/2011, 12:40 PM

## 2012-02-25 ENCOUNTER — Encounter (HOSPITAL_COMMUNITY): Payer: Self-pay | Admitting: *Deleted

## 2012-02-25 ENCOUNTER — Emergency Department (HOSPITAL_COMMUNITY)
Admission: EM | Admit: 2012-02-25 | Discharge: 2012-02-25 | Disposition: A | Discharge status: Home / Self Care | Payer: Medicaid Other | Attending: Emergency Medicine | Admitting: Emergency Medicine

## 2012-02-25 DIAGNOSIS — J45909 Unspecified asthma, uncomplicated: Secondary | ICD-10-CM | POA: Insufficient documentation

## 2012-02-25 DIAGNOSIS — Z79899 Other long term (current) drug therapy: Secondary | ICD-10-CM | POA: Insufficient documentation

## 2012-02-25 DIAGNOSIS — J45901 Unspecified asthma with (acute) exacerbation: Secondary | ICD-10-CM

## 2012-02-25 DIAGNOSIS — R0602 Shortness of breath: Secondary | ICD-10-CM | POA: Insufficient documentation

## 2012-02-25 MED ORDER — ALBUTEROL SULFATE (5 MG/ML) 0.5% IN NEBU
5.0000 mg | INHALATION_SOLUTION | Freq: Once | RESPIRATORY_TRACT | Status: AC
  Administered 2012-02-25: 5 mg via RESPIRATORY_TRACT
  Filled 2012-02-25: qty 1

## 2012-02-25 MED ORDER — ALBUTEROL SULFATE HFA 108 (90 BASE) MCG/ACT IN AERS: 2.0000 | INHALATION_SPRAY | RESPIRATORY_TRACT | Status: DC | PRN

## 2012-02-25 MED ORDER — PREDNISONE 20 MG PO TABS: 40.0000 mg | ORAL_TABLET | Freq: Every day | ORAL | Status: AC

## 2012-02-25 MED ORDER — PREDNISONE 20 MG PO TABS
40.0000 mg | ORAL_TABLET | Freq: Once | ORAL | Status: AC
  Administered 2012-02-25: 40 mg via ORAL
  Filled 2012-02-25: qty 2

## 2012-02-25 MED ORDER — IPRATROPIUM BROMIDE 0.02 % IN SOLN
0.5000 mg | Freq: Once | RESPIRATORY_TRACT | Status: AC
  Administered 2012-02-25: 0.5 mg via RESPIRATORY_TRACT
  Filled 2012-02-25: qty 2.5

## 2012-02-25 NOTE — ED Provider Notes (Signed)
History     CSN: [id number]  Arrival date & time 02/25/12  0345   First MD Initiated Contact with Patient 02/25/12 0406      Chief Complaint  Patient presents with  . Asthma    (Consider location/radiation/quality/duration/timing/severity/associated sxs/prior treatment) HPI Comments: 31 year old female with a history of asthma, Crohn's disease, and GERD who presents with a complaint of shortness of breath. This was gradual in onset earlier in the evening, persistent, gradually getting worse and associated with wheezing. She denies fevers, swelling, nausea vomiting diarrhea. This is similar to her asthma exacerbations in the past and has improved minimally with an albuterol inhaler at home. Symptoms were severe prior to arrival. In addition the patient became very anxious because she could not find her albuterol nebulizer medications. She did use an MDI with mild improvement.  She denies coughing, fever, chest pain or back pain. She has not been seen in the emergency department for approximately one year per her report, no steroids approximately one year.  Patient is a 31 y.o. female presenting with asthma. The history is provided by the patient.  Asthma    Past Medical History  Diagnosis Date  . Asthma     uses rescue inhaler as need-will bring dos  . GERD (gastroesophageal reflux disease)     only with pregnancy-uses tums prn  . Crohn's disease   . Abnormal antibody titer     Positive Antibody KELL  . Blood transfusion     at [facility]    Past Surgical History  Procedure Date  . Cesarean section   . Nasal endoscopy   . Lithotripsy 2005  . Colonoscopy   . Cesarean section 10/19/2011    Procedure: CESAREAN SECTION;  Surgeon: Roseanna Rainbow, MD;  Location: [facility];  Service: Gynecology;  Laterality: N/A;    History reviewed. No pertinent family history.  History  Substance Use Topics  . Smoking status: Former Smoker -- 7 years    Types: Cigarettes    Quit date:  03/16/2011  . Smokeless tobacco: Not on file  . Alcohol Use: No    OB History    Grav Para Term Preterm Abortions TAB SAB Ect Mult Living   4 3 3  1 1    3       Review of Systems  All other systems reviewed and are negative.    Allergies  Remicade  Home Medications   Current Outpatient Rx  Name Route Sig Dispense Refill  . ALBUTEROL SULFATE HFA 108 (90 BASE) MCG/ACT IN AERS Inhalation Inhale 2 puffs into the lungs every 6 (six) hours as needed. Shortness of breath       . MESALAMINE 800 MG PO TBEC Oral Take 1 tablet by mouth daily.      . NORETHINDRONE ACET-ETHINYL EST 1-20 MG-MCG PO TABS Oral Take 1 tablet by mouth daily. Start in 3 weeks 1 Package 11  . ALBUTEROL SULFATE HFA 108 (90 BASE) MCG/ACT IN AERS Inhalation Inhale 2 puffs into the lungs every 4 (four) hours as needed for wheezing or shortness of breath. 1 Inhaler 3  . PREDNISONE 20 MG PO TABS Oral Take 2 tablets (40 mg total) by mouth daily. 10 tablet 0    BP 133/81  Pulse 99  Temp(Src) 97.8 F (36.6 C) (Oral)  Resp 22  SpO2 97%  LMP 02/24/2012  Physical Exam  Nursing note and vitals reviewed. Constitutional: She appears well-developed and well-nourished. No distress.  HENT:  Head: Normocephalic and atraumatic.  Mouth/Throat: Oropharynx is clear and moist. No oropharyngeal exudate.  Eyes: Conjunctivae and EOM are normal. Pupils are equal, round, and reactive to light. Right eye exhibits no discharge. Left eye exhibits no discharge. No scleral icterus.  Neck: Normal range of motion. Neck supple. No JVD present. No thyromegaly present.  Cardiovascular: Normal rate, regular rhythm, normal heart sounds and intact distal pulses.  Exam reveals no gallop and no friction rub.   No murmur heard. Pulmonary/Chest: Effort normal. No respiratory distress. She has wheezes ( Wheezing on expiration, speaking in full sentences, no increased work of breathing after first nebulizer treatment). She has no rales.  Abdominal:  Soft. Bowel sounds are normal. She exhibits no distension and no mass. There is no tenderness.  Musculoskeletal: Normal range of motion. She exhibits no edema and no tenderness.  Lymphadenopathy:    She has no cervical adenopathy.  Neurological: She is alert. Coordination normal.  Skin: Skin is warm and dry. No rash noted. No erythema.  Psychiatric: She has a normal mood and affect. Her behavior is normal.    ED Course  Procedures (including critical care time)  Labs Reviewed - No data to display No results found.   1. Asthma attack       MDM  Significant improvement after her first nebulizer treatment with albuterol and Atrovent, we'll add prednisone, second treatment of albuterol, short observation prior to discharge. Otherwise no rales, no fever, improved significantly after first treatment. Suspect asthma exacerbation  Repeat evaluation of the lung shows no wheezing after second nebulizer treatment and prednisone. Oxygen saturation 100% on room air, speaking in full sentences and states that she feels 100% better.  Discharge Prescriptions include:  #1 Prednisone #2 Albuterol      Vida Roller, MD 02/25/12 [phone]

## 2012-02-25 NOTE — Discharge Instructions (Signed)
Prednisone once a day for 5 days, albuterol inhaler 2 puffs every 4 hours for 24 hours then as needed. Call your doctor in the morning for a recheck within the next 2-3 days if still having symptoms or return to the emergency department for severe or worsening pain, cough, shortness of breath or high fevers. RESOURCE GUIDE  Dental Problems  Patients with Medicaid: [facility] [phone] [address]                                           [phone] [address] Phone:  [phone]                                                  Phone:  [phone]  If unable to pay or uninsured, contact:  [facility] or [facility]. to become qualified for the adult dental clinic.  Chronic Pain Problems Contact [facility]  [phone] Patients need to be referred by their primary care doctor.  Insufficient Money for Medicine Contact [employer]:  call "211" or [facility] [phone])[phone].  No Primary Care Doctor Call [facility]  [phone] Other agencies that provide inexpensive medical care    [facility]  [phone]    [facility]  [phone]    [facility]  [phone]    [facility]  [phone]    [facility]  [phone]    [facility]  [phone]  Psychological Services [facility]  [phone] [facility]  [phone] [facility]   [phone] (emergency services [phone])  Substance Abuse Resources [facility]  [phone] [facility] [phone] [facility] [phone] Floydene Flock [phone] Residential & Outpatient Substance Abuse Program  [phone]  Abuse/Neglect [facility] Child Abuse Hotline 4230333829 [facility] Child Abuse Hotline [phone] (After Hours)  Emergency Shelter [facility] [phone]  Maternity Homes [facility] [place] [facility]  [facility] [place] [phone] [facility] [phone]  MRSA Hotline #:   306-281-7371    [facility] Resources  [facility]     [employer]                          [facility] Dept. [address] [place]                       [address]      [address]  [place]                                                Cristobal Goldmann Phone:  [phone]  Phone:  [phone]                 Phone:  [phone]  [facility] Phone:  [phone]  [place] Child Abuse Hotline (336) 342-1394 [phone] (After Hours)   

## 2012-02-25 NOTE — ED Notes (Signed)
Pt c/o shortness of breath, hx of asthma, wheezing, unable to find inhaler.

## 2012-11-11 ENCOUNTER — Emergency Department (HOSPITAL_COMMUNITY): Payer: Medicaid Other

## 2012-11-11 ENCOUNTER — Emergency Department (HOSPITAL_COMMUNITY)
Admission: EM | Admit: 2012-11-11 | Discharge: 2012-11-11 | Disposition: A | Discharge status: Home / Self Care | Payer: Medicaid Other | Attending: Emergency Medicine | Admitting: Emergency Medicine

## 2012-11-11 ENCOUNTER — Encounter (HOSPITAL_COMMUNITY): Payer: Self-pay | Admitting: *Deleted

## 2012-11-11 DIAGNOSIS — Z8719 Personal history of other diseases of the digestive system: Secondary | ICD-10-CM | POA: Insufficient documentation

## 2012-11-11 DIAGNOSIS — J159 Unspecified bacterial pneumonia: Secondary | ICD-10-CM | POA: Insufficient documentation

## 2012-11-11 DIAGNOSIS — R0602 Shortness of breath: Secondary | ICD-10-CM | POA: Insufficient documentation

## 2012-11-11 DIAGNOSIS — J189 Pneumonia, unspecified organism: Secondary | ICD-10-CM

## 2012-11-11 DIAGNOSIS — Z87891 Personal history of nicotine dependence: Secondary | ICD-10-CM | POA: Insufficient documentation

## 2012-11-11 DIAGNOSIS — Z79899 Other long term (current) drug therapy: Secondary | ICD-10-CM | POA: Insufficient documentation

## 2012-11-11 MED ORDER — AZITHROMYCIN 250 MG PO TABS: ORAL_TABLET | ORAL | Status: DC

## 2012-11-11 MED ORDER — ALBUTEROL SULFATE (5 MG/ML) 0.5% IN NEBU
5.0000 mg | INHALATION_SOLUTION | Freq: Once | RESPIRATORY_TRACT | Status: AC
  Administered 2012-11-11: 5 mg via RESPIRATORY_TRACT

## 2012-11-11 MED ORDER — CEFTRIAXONE SODIUM 1 G IJ SOLR
1.0000 g | Freq: Once | INTRAMUSCULAR | Status: AC
  Administered 2012-11-11: 1 g via INTRAMUSCULAR
  Filled 2012-11-11: qty 10

## 2012-11-11 MED ORDER — ALBUTEROL SULFATE (2.5 MG/3ML) 0.083% IN NEBU: 2.5000 mg | INHALATION_SOLUTION | Freq: Four times a day (QID) | RESPIRATORY_TRACT | Status: DC | PRN

## 2012-11-11 MED ORDER — PREDNISONE 20 MG PO TABS
60.0000 mg | ORAL_TABLET | Freq: Once | ORAL | Status: AC
  Administered 2012-11-11: 60 mg via ORAL
  Filled 2012-11-11: qty 3

## 2012-11-11 MED ORDER — ALBUTEROL SULFATE (5 MG/ML) 0.5% IN NEBU
INHALATION_SOLUTION | RESPIRATORY_TRACT | Status: AC
  Filled 2012-11-11: qty 1

## 2012-11-11 MED ORDER — IPRATROPIUM BROMIDE 0.02 % IN SOLN
0.5000 mg | Freq: Once | RESPIRATORY_TRACT | Status: AC
  Administered 2012-11-11: 0.5 mg via RESPIRATORY_TRACT

## 2012-11-11 MED ORDER — IPRATROPIUM BROMIDE 0.02 % IN SOLN
RESPIRATORY_TRACT | Status: AC
  Administered 2012-11-11: 0.5 mg via RESPIRATORY_TRACT
  Filled 2012-11-11: qty 2.5

## 2012-11-11 MED ORDER — ALBUTEROL (5 MG/ML) CONTINUOUS INHALATION SOLN
10.0000 mg/h | INHALATION_SOLUTION | Freq: Once | RESPIRATORY_TRACT | Status: AC
  Administered 2012-11-11: 10 mg/h via RESPIRATORY_TRACT

## 2012-11-11 MED ORDER — PREDNISONE 10 MG PO TABS: 20.0000 mg | ORAL_TABLET | Freq: Every day | ORAL | Status: DC

## 2012-11-11 MED ORDER — ALBUTEROL SULFATE (5 MG/ML) 0.5% IN NEBU
INHALATION_SOLUTION | RESPIRATORY_TRACT | Status: AC
  Filled 2012-11-11: qty 2

## 2012-11-11 MED ORDER — LIDOCAINE HCL 1 % IJ SOLN
2.0000 mL | Freq: Once | INTRAMUSCULAR | Status: AC
  Administered 2012-11-11: 2 mL
  Filled 2012-11-11: qty 20

## 2012-11-11 NOTE — ED Provider Notes (Signed)
History     CSN: [id number]  Arrival date & time 11/11/12  4098   First MD Initiated Contact with Patient 11/11/12 [phone]      Chief Complaint  Patient presents with  . Asthma    (Consider location/radiation/quality/duration/timing/severity/associated sxs/prior treatment) Patient is a 31 y.o. female presenting with asthma. The history is provided by the patient.  Asthma   patient here with cough and shortness of breath x3 days. No fever noted and cough has been nonproductive. History of asthma and is using inhaler without response. Similar symptoms in the past associated with pneumonia. The vomiting or diarrhea. Symptoms worse with going out in the cool air.  Past Medical History  Diagnosis Date  . Asthma     uses rescue inhaler as need-will bring dos  . GERD (gastroesophageal reflux disease)     only with pregnancy-uses tums prn  . Crohn's disease   . Abnormal antibody titer     Positive Antibody KELL  . Blood transfusion     at [facility]    Past Surgical History  Procedure Date  . Cesarean section   . Nasal endoscopy   . Lithotripsy 2005  . Colonoscopy   . Cesarean section 10/19/2011    Procedure: CESAREAN SECTION;  Surgeon: Roseanna Rainbow, MD;  Location: [facility];  Service: Gynecology;  Laterality: N/A;    History reviewed. No pertinent family history.  History  Substance Use Topics  . Smoking status: Former Smoker -- 7 years    Types: Cigarettes    Quit date: 03/16/2011  . Smokeless tobacco: Not on file  . Alcohol Use: No    OB History    Grav Para Term Preterm Abortions TAB SAB Ect Mult Living   4 3 3  1 1    3       Review of Systems  All other systems reviewed and are negative.    Allergies  Remicade  Home Medications   Current Outpatient Rx  Name  Route  Sig  Dispense  Refill  . ALBUTEROL SULFATE HFA 108 (90 BASE) MCG/ACT IN AERS   Inhalation   Inhale 2 puffs into the lungs every 6 (six) hours as needed. Shortness of breath                BP 116/66  Pulse 103  Temp 97.4 F (36.3 C) (Oral)  Resp 22  SpO2 99%  Physical Exam  Nursing note and vitals reviewed. Constitutional: She is oriented to person, place, and time. She appears well-developed and well-nourished.  Non-toxic appearance. No distress.  HENT:  Head: Normocephalic and atraumatic.  Eyes: Conjunctivae normal, EOM and lids are normal. Pupils are equal, round, and reactive to light.  Neck: Normal range of motion. Neck supple. No tracheal deviation present. No mass present.  Cardiovascular: Regular rhythm and normal heart sounds.  Tachycardia present.  Exam reveals no gallop.   No murmur heard. Pulmonary/Chest: Effort normal. No stridor. No respiratory distress. She has decreased breath sounds. She has wheezes. She has no rhonchi. She has no rales.  Abdominal: Soft. Normal appearance and bowel sounds are normal. She exhibits no distension. There is no tenderness. There is no rebound and no CVA tenderness.  Musculoskeletal: Normal range of motion. She exhibits no edema and no tenderness.  Neurological: She is alert and oriented to person, place, and time. She has normal strength. No cranial nerve deficit or sensory deficit. GCS eye subscore is 4. GCS verbal subscore is 5. GCS motor subscore  is 6.  Skin: Skin is warm and dry. No abrasion and no rash noted.  Psychiatric: She has a normal mood and affect. Her speech is normal and behavior is normal.    ED Course  Procedures (including critical care time)  Labs Reviewed - No data to display No results found.   No diagnosis found.    MDM  Patient given albuterol continuous treatment over one hour. She was also given prednisone and dose of Rocephin. Reassessed multiple times and wheezing has improved. She is nontoxic. She is due for discharge        Toy Baker, MD 11/11/12 [phone]

## 2012-11-11 NOTE — ED Notes (Addendum)
Pt in c/o asthma attack, no relief with home inhaler, states she has been coughing over last few days. Pt speaking in full sentences, audible wheezing noted.

## 2013-09-22 ENCOUNTER — Emergency Department (HOSPITAL_COMMUNITY)
Admission: EM | Admit: 2013-09-22 | Discharge: 2013-09-22 | Disposition: A | Discharge status: Home / Self Care | Payer: Medicaid Other | Attending: Emergency Medicine | Admitting: Emergency Medicine

## 2013-09-22 ENCOUNTER — Encounter (HOSPITAL_COMMUNITY): Payer: Self-pay | Admitting: *Deleted

## 2013-09-22 DIAGNOSIS — Z79899 Other long term (current) drug therapy: Secondary | ICD-10-CM | POA: Insufficient documentation

## 2013-09-22 DIAGNOSIS — J45909 Unspecified asthma, uncomplicated: Secondary | ICD-10-CM | POA: Insufficient documentation

## 2013-09-22 DIAGNOSIS — Z3202 Encounter for pregnancy test, result negative: Secondary | ICD-10-CM | POA: Insufficient documentation

## 2013-09-22 DIAGNOSIS — Z87891 Personal history of nicotine dependence: Secondary | ICD-10-CM | POA: Insufficient documentation

## 2013-09-22 DIAGNOSIS — N390 Urinary tract infection, site not specified: Secondary | ICD-10-CM

## 2013-09-22 DIAGNOSIS — Z8719 Personal history of other diseases of the digestive system: Secondary | ICD-10-CM | POA: Insufficient documentation

## 2013-09-22 LAB — URINE MICROSCOPIC-ADD ON

## 2013-09-22 LAB — URINALYSIS, ROUTINE W REFLEX MICROSCOPIC
Nitrite: POSITIVE — AB
Specific Gravity, Urine: 1.027 (ref 1.005–1.030)
Urobilinogen, UA: 0.2 mg/dL (ref 0.0–1.0)
pH: 6 (ref 5.0–8.0)

## 2013-09-22 LAB — POCT PREGNANCY, URINE: Preg Test, Ur: NEGATIVE

## 2013-09-22 MED ORDER — NITROFURANTOIN MONOHYD MACRO 100 MG PO CAPS: 100.0000 mg | ORAL_CAPSULE | Freq: Two times a day (BID) | ORAL | Status: DC

## 2013-09-22 MED ORDER — PHENAZOPYRIDINE HCL 200 MG PO TABS: 200.0000 mg | ORAL_TABLET | Freq: Three times a day (TID) | ORAL | Status: DC | PRN

## 2013-09-22 MED ORDER — NITROFURANTOIN MONOHYD MACRO 100 MG PO CAPS
100.0000 mg | ORAL_CAPSULE | Freq: Once | ORAL | Status: AC
  Administered 2013-09-22: 100 mg via ORAL
  Filled 2013-09-22: qty 1

## 2013-09-22 NOTE — ED Notes (Signed)
Pt states that she has been having urinary frequency, urgency and burning when urinates for 10 days; pt reports that the symptoms have gotten progressively worse and now she feels like she has to urinate all the time

## 2013-09-22 NOTE — ED Provider Notes (Signed)
Medical screening examination/treatment/procedure(s) were performed by non-physician practitioner and as supervising physician I was immediately available for consultation/collaboration.  Toy Baker, MD 09/22/13 2325

## 2013-09-22 NOTE — ED Provider Notes (Signed)
CSN: [id number]     Arrival date & time 09/22/13  1916 History   None  This chart was scribed for Jamie Andrew PA-C, a non-physician practitioner working with Toy Baker, MD by Jamie Wilkinson, ED Scribe. This patient was seen in room WTR4/WLPT4 and the patient's care was started at 8:35 PM     Chief Complaint  Patient presents with  . Urinary Tract Infection   The history is provided by the patient. No language interpreter was used.   HPI Comments: Jamie Wilkinson is a 32 y.o. female who presents to the Emergency Department complaining intermittent urinary urgency onset 2 weeks. Reports associated urinary frequency with little output, and low back ache. Describes urinary symptoms as burning and pressure. Reports pain is mildly alleviated by certain positions in bed and exacerbated by using the bathroom. Denies associated diarrhea, menstrual problems, fever, emesis, nausea, abdominal pain, and flank pain. Reports taking tylenol with mild relief of symptoms.  No other aggravating or alleviating factors. No other associated symptoms.    Past Medical History  Diagnosis Date  . Asthma     uses rescue inhaler as need-will bring dos  . GERD (gastroesophageal reflux disease)     only with pregnancy-uses tums prn  . Crohn's disease   . Abnormal antibody titer     Positive Antibody KELL  . Blood transfusion     at [facility]   Past Surgical History  Procedure Laterality Date  . Cesarean section    . Nasal endoscopy    . Lithotripsy  2005  . Colonoscopy    . Cesarean section  10/19/2011    Procedure: CESAREAN SECTION;  Surgeon: Roseanna Rainbow, MD;  Location: [facility];  Service: Gynecology;  Laterality: N/A;   No family history on file. History  Substance Use Topics  . Smoking status: Former Smoker -- 7 years    Types: Cigarettes    Quit date: 03/16/2011  . Smokeless tobacco: Not on file  . Alcohol Use: No   OB History   Grav Para Term Preterm Abortions TAB SAB Ect  Mult Living   4 3 3  1 1    3      Review of Systems  All other systems reviewed and are negative.   A complete 10 system review of systems was obtained and all systems are negative except as noted in the HPI and PMHx.   Allergies  Remicade  Home Medications   Current Outpatient Rx  Name  Route  Sig  Dispense  Refill  . acetaminophen (TYLENOL) 500 MG tablet   Oral   Take 500 mg by mouth every 6 (six) hours as needed for pain.         Marland Kitchen albuterol (PROVENTIL HFA;VENTOLIN HFA) 108 (90 BASE) MCG/ACT inhaler   Inhalation   Inhale 2 puffs into the lungs every 6 (six) hours as needed. Shortness of breath            . albuterol (PROVENTIL) (2.5 MG/3ML) 0.083% nebulizer solution   Nebulization   Take 3 mLs (2.5 mg total) by nebulization every 6 (six) hours as needed for wheezing.   75 mL   12    BP 107/82  Pulse 103  Temp(Src) 98.2 F (36.8 C) (Oral)  Resp 20  Ht 5\' 5"  (1.651 m)  Wt 220 lb (99.791 kg)  BMI 36.61 kg/m2  SpO2 97%  LMP 09/18/2013  Breastfeeding? No Physical Exam  Nursing note and vitals reviewed. Constitutional: She is  oriented to person, place, and time. She appears well-developed and well-nourished. No distress.  HENT:  Head: Normocephalic and atraumatic.  Eyes: Conjunctivae and EOM are normal.  Neck: Neck supple. No tracheal deviation present.  Cardiovascular: Normal rate and regular rhythm.   No murmur heard. Pulmonary/Chest: Effort normal and breath sounds normal. No respiratory distress.  Abdominal: Soft. There is no tenderness. There is no CVA tenderness.  No CVA tenderness   Musculoskeletal: Normal range of motion.  Neurological: She is alert and oriented to person, place, and time.  Skin: Skin is warm and dry.  Psychiatric: She has a normal mood and affect. Her behavior is normal.    ED Course  Procedures   COORDINATION OF CARE:  Nursing notes reviewed. Vital signs reviewed. Initial pt interview and examination performed.   Treatment plan initiated: Medications  nitrofurantoin (macrocrystal-monohydrate) (MACROBID) capsule 100 mg (not administered)   Initial diagnostic testing ordered.    8:41 PM-UA demonstrating signs of UTI. Discussed treatment plan with pt at bedside, which includes antibiotic for UTI and pt agrees with plan.  Pt informed of return precautions and is comfortable with discharge at this time.    DIAGNOSTIC STUDIES: Results for orders placed during the hospital encounter of 09/22/13  URINALYSIS, ROUTINE W REFLEX MICROSCOPIC      Result Value Range   Color, Urine YELLOW  YELLOW   APPearance CLOUDY (*) CLEAR   Specific Gravity, Urine 1.027  1.005 - 1.030   pH 6.0  5.0 - 8.0   Glucose, UA NEGATIVE  NEGATIVE mg/dL   Hgb urine dipstick LARGE (*) NEGATIVE   Bilirubin Urine NEGATIVE  NEGATIVE   Ketones, ur NEGATIVE  NEGATIVE mg/dL   Protein, ur 30 (*) NEGATIVE mg/dL   Urobilinogen, UA 0.2  0.0 - 1.0 mg/dL   Nitrite POSITIVE (*) NEGATIVE   Leukocytes, UA LARGE (*) NEGATIVE  URINE MICROSCOPIC-ADD ON      Result Value Range   Squamous Epithelial / LPF RARE  RARE   WBC, UA TOO NUMEROUS TO COUNT  <3 WBC/hpf   RBC / HPF TOO NUMEROUS TO COUNT  <3 RBC/hpf   Bacteria, UA FEW (*) RARE  POCT PREGNANCY, URINE      Result Value Range   Preg Test, Ur NEGATIVE  NEGATIVE     MDM   1. UTI (lower urinary tract infection)        I personally performed the services described in this documentation, which was scribed in my presence. The recorded information has been reviewed and is accurate.   Angus Seller, PA-C 09/22/13 2119

## 2013-09-22 NOTE — Progress Notes (Signed)
Patient confrims she does not have a pcp.  [facility] provided patient with a list of pcps who accept Medicaid insurance.  Patient thankful for resources.

## 2013-09-24 LAB — URINE CULTURE

## 2013-12-15 ENCOUNTER — Emergency Department (HOSPITAL_COMMUNITY)
Admission: EM | Admit: 2013-12-15 | Discharge: 2013-12-15 | Disposition: A | Discharge status: Home / Self Care | Payer: Medicaid Other | Attending: Emergency Medicine | Admitting: Emergency Medicine

## 2013-12-15 DIAGNOSIS — Z8719 Personal history of other diseases of the digestive system: Secondary | ICD-10-CM | POA: Insufficient documentation

## 2013-12-15 DIAGNOSIS — Z87891 Personal history of nicotine dependence: Secondary | ICD-10-CM | POA: Insufficient documentation

## 2013-12-15 DIAGNOSIS — Z3202 Encounter for pregnancy test, result negative: Secondary | ICD-10-CM | POA: Insufficient documentation

## 2013-12-15 DIAGNOSIS — E669 Obesity, unspecified: Secondary | ICD-10-CM | POA: Insufficient documentation

## 2013-12-15 DIAGNOSIS — N390 Urinary tract infection, site not specified: Secondary | ICD-10-CM | POA: Insufficient documentation

## 2013-12-15 DIAGNOSIS — J45909 Unspecified asthma, uncomplicated: Secondary | ICD-10-CM | POA: Insufficient documentation

## 2013-12-15 DIAGNOSIS — Z9889 Other specified postprocedural states: Secondary | ICD-10-CM | POA: Insufficient documentation

## 2013-12-15 DIAGNOSIS — Z79899 Other long term (current) drug therapy: Secondary | ICD-10-CM | POA: Insufficient documentation

## 2013-12-15 LAB — URINALYSIS, ROUTINE W REFLEX MICROSCOPIC
Ketones, ur: NEGATIVE mg/dL
Nitrite: NEGATIVE
Protein, ur: NEGATIVE mg/dL
Urobilinogen, UA: 0.2 mg/dL (ref 0.0–1.0)

## 2013-12-15 LAB — URINE MICROSCOPIC-ADD ON

## 2013-12-15 MED ORDER — CEFTRIAXONE SODIUM 1 G IJ SOLR
1.0000 g | Freq: Once | INTRAMUSCULAR | Status: AC
  Administered 2013-12-15: 1 g via INTRAMUSCULAR
  Filled 2013-12-15: qty 10

## 2013-12-15 MED ORDER — NITROFURANTOIN MONOHYD MACRO 100 MG PO CAPS: 100.0000 mg | ORAL_CAPSULE | Freq: Two times a day (BID) | ORAL | Status: DC

## 2013-12-15 NOTE — ED Provider Notes (Signed)
  Medical screening examination/treatment/procedure(s) were performed by non-physician practitioner and as supervising physician I was immediately available for consultation/collaboration.  EKG Interpretation   None          Gerhard Munch, MD 12/15/13 2326

## 2013-12-15 NOTE — ED Notes (Signed)
Pt states she has had a urinary tract infection before and feels like she has one now. Polyuria. Denies vaginal symptoms

## 2013-12-15 NOTE — ED Provider Notes (Signed)
CSN: [id number]     Arrival date & time 12/15/13  1749 History  This chart was scribed for non-physician practitioner, [name], PA-C,working with Gerhard Munch, MD, by Karle Plumber, ED Scribe.  This patient was seen in room WTR6/WTR6 and the patient's care was started at 8:26 PM.  Chief Complaint  Patient presents with  . Polyuria   The history is provided by the patient. No language interpreter was used.   HPI Comments:  Jamie Wilkinson is a 32 y.o. female who presents to the Emergency Department complaining of increased urination. She reports the feeling of urgency and feeling as if she has to go even after she is done. She reports menstruation at this time. She reports associated mild abdominal pain and pressure. She denies fever, chills, nausea, diaphoresis, vomiting, cough, or cold symptoms. She denies pregnancy. She reports family h/o DM.   Past Medical History  Diagnosis Date  . Asthma     uses rescue inhaler as need-will bring dos  . GERD (gastroesophageal reflux disease)     only with pregnancy-uses tums prn  . Crohn's disease   . Abnormal antibody titer     Positive Antibody KELL  . Blood transfusion     at [facility]   Past Surgical History  Procedure Laterality Date  . Cesarean section    . Nasal endoscopy    . Lithotripsy  2005  . Colonoscopy    . Cesarean section  10/19/2011    Procedure: CESAREAN SECTION;  Surgeon: Roseanna Rainbow, MD;  Location: [facility];  Service: Gynecology;  Laterality: N/A;   No family history on file. History  Substance Use Topics  . Smoking status: Former Smoker -- 7 years    Types: Cigarettes    Quit date: 03/16/2011  . Smokeless tobacco: Not on file  . Alcohol Use: No   OB History   Grav Para Term Preterm Abortions TAB SAB Ect Mult Living   4 3 3  1 1    3      Review of Systems  Constitutional: Negative for fever, chills and diaphoresis.  Respiratory: Negative for cough.   Gastrointestinal: Negative for nausea  and vomiting.  Genitourinary: Positive for frequency.  All other systems reviewed and are negative.    Allergies  Remicade  Home Medications   Current Outpatient Rx  Name  Route  Sig  Dispense  Refill  . acetaminophen (TYLENOL) 500 MG tablet   Oral   Take 500 mg by mouth every 6 (six) hours as needed for pain.         Marland Kitchen albuterol (PROVENTIL HFA;VENTOLIN HFA) 108 (90 BASE) MCG/ACT inhaler   Inhalation   Inhale 2 puffs into the lungs every 6 (six) hours as needed. Shortness of breath            . albuterol (PROVENTIL) (2.5 MG/3ML) 0.083% nebulizer solution   Nebulization   Take 3 mLs (2.5 mg total) by nebulization every 6 (six) hours as needed for wheezing.   75 mL   12   . nitrofurantoin, macrocrystal-monohydrate, (MACROBID) 100 MG capsule   Oral   Take 1 capsule (100 mg total) by mouth 2 (two) times daily. X 7 days   14 capsule   0   . phenazopyridine (PYRIDIUM) 200 MG tablet   Oral   Take 1 tablet (200 mg total) by mouth 3 (three) times daily as needed for pain.   12 tablet   0    Triage Vitals: BP  116/79  Pulse 82  Temp(Src) 97.7 F (36.5 C) (Oral)  Resp 17  SpO2 98%  LMP 12/11/2013 Physical Exam  Nursing note and vitals reviewed. Constitutional: She is oriented to person, place, and time. She appears well-developed and well-nourished.  Obese  HENT:  Head: Normocephalic and atraumatic.  Eyes: EOM are normal.  Neck: Normal range of motion.  Cardiovascular: Normal rate, regular rhythm and normal heart sounds.   Pulmonary/Chest: Effort normal.  Abdominal: Soft. There is tenderness (suprapubic tenderness).  Genitourinary:  No CVA tenderness  Musculoskeletal: Normal range of motion.  Neurological: She is alert and oriented to person, place, and time.  Skin: Skin is warm and dry.  Psychiatric: She has a normal mood and affect. Her behavior is normal.    ED Course  Procedures  DIAGNOSTIC STUDIES: Oxygen Saturation is 98% on RA, normal by my  interpretation.   COORDINATION OF CARE: 8:30 PM- Will obtain urine sample for urinalysis. Pt verbalizes understanding and agrees to plan.   Results for orders placed during the hospital encounter of 12/15/13  URINALYSIS, ROUTINE W REFLEX MICROSCOPIC      Result Value Range   Color, Urine YELLOW  YELLOW   APPearance CLOUDY (*) CLEAR   Specific Gravity, Urine 1.028  1.005 - 1.030   pH 7.0  5.0 - 8.0   Glucose, UA NEGATIVE  NEGATIVE mg/dL   Hgb urine dipstick LARGE (*) NEGATIVE   Bilirubin Urine NEGATIVE  NEGATIVE   Ketones, ur NEGATIVE  NEGATIVE mg/dL   Protein, ur NEGATIVE  NEGATIVE mg/dL   Urobilinogen, UA 0.2  0.0 - 1.0 mg/dL   Nitrite NEGATIVE  NEGATIVE   Leukocytes, UA MODERATE (*) NEGATIVE  GLUCOSE, CAPILLARY      Result Value Range   Glucose-Capillary 93  70 - 99 mg/dL  URINE MICROSCOPIC-ADD ON      Result Value Range   Squamous Epithelial / LPF RARE  RARE   WBC, UA TOO NUMEROUS TO COUNT  <3 WBC/hpf   RBC / HPF 0-2  <3 RBC/hpf   Bacteria, UA MANY (*) RARE   Urine-Other MUCOUS PRESENT    POCT PREGNANCY, URINE      Result Value Range   Preg Test, Ur NEGATIVE  NEGATIVE     MDM   1. UTI (lower urinary tract infection)      I personally performed the services described in this documentation, which was scribed in my presence. The recorded information has been reviewed and is accurate.    Angus Seller, PA-C 12/15/13 2145

## 2013-12-17 LAB — URINE CULTURE: Colony Count: >=100000

## 2013-12-18 ENCOUNTER — Telehealth (HOSPITAL_COMMUNITY): Payer: Self-pay | Admitting: *Deleted

## 2013-12-18 NOTE — Progress Notes (Signed)
ED Antimicrobial Stewardship Positive Culture Follow Up   Jeanella CrazeJennifer M Atayde is an 33 y.o. female who [facility] with a chief complaint of  Chief Complaint  Patient presents with  . Polyuria    Recent Results (from the past 720 hour(s))  URINE CULTURE     Status: None   Collection Time    12/15/13  8:36 PM      Result Value Range Status   Specimen Description URINE, CLEAN CATCH   Final   Special Requests NONE   Final   Culture  Setup Time     Final   Value: 12/16/2013 04:58     Performed at [facility]   Colony Count     Final   Value: >=100,000 COLONIES/ML     Performed at [facility]   Culture     Final   Value: PROTEUS MIRABILIS     Performed at [facility]   Report Status 12/17/2013 FINAL   Final   Organism ID, Bacteria PROTEUS MIRABILIS   Final    [x]  Treated with nitrofurantoin, organism resistant to prescribed antimicrobial []  Patient discharged originally without antimicrobial agent and treatment is now indicated  New antibiotic prescription: Bactrim DS 1 tablet BID x 3 days  ED Provider: Arthor CaptainAbigail Harris, PA-C   Mickeal SkinnerFrens, Wynetta Seith John 12/18/2013, 9:51 AM Infectious Diseases Pharmacist Phone# [phone]

## 2013-12-18 NOTE — ED Notes (Signed)
Post ED Visit - Positive Culture Follow-up: Successful Patient Follow-Up  Culture assessed and recommendations reviewed by: []  Wes Dulaney, Pharm.D., BCPS [x]  [name], [address] BCPS []  [name], Pharm.D., BCPS []  [name], [address]Pharm[address]., BCPS, AAHIVP []  [name], Pharm.D., BCPS, AAHIVP [X]  Treated with nitrofurantoin, organism resistant to prescribed antimicrobial  [ ]  Patient discharged originally without antimicrobial agent and treatment is now indicated  New antibiotic prescription: Bactrim DS 1 tablet BID x 3 days  ED Provider: Arthor CaptainAbigail Harris, PA-C    Larena Soxunnally, Jamie Wilkinson 12/18/2013, 2:52 PM

## 2013-12-22 NOTE — ED Notes (Signed)
Unable to contact via phone letter sent to [facility] address. 

## 2014-09-14 ENCOUNTER — Emergency Department (HOSPITAL_COMMUNITY): Payer: Medicaid Other

## 2014-09-14 ENCOUNTER — Encounter (HOSPITAL_COMMUNITY): Payer: Self-pay | Admitting: Emergency Medicine

## 2014-09-14 ENCOUNTER — Emergency Department (HOSPITAL_COMMUNITY)
Admission: EM | Admit: 2014-09-14 | Discharge: 2014-09-14 | Disposition: A | Discharge status: Home / Self Care | Payer: Medicaid Other | Attending: Emergency Medicine | Admitting: Emergency Medicine

## 2014-09-14 DIAGNOSIS — Z87891 Personal history of nicotine dependence: Secondary | ICD-10-CM | POA: Insufficient documentation

## 2014-09-14 DIAGNOSIS — Z79899 Other long term (current) drug therapy: Secondary | ICD-10-CM | POA: Insufficient documentation

## 2014-09-14 DIAGNOSIS — J45901 Unspecified asthma with (acute) exacerbation: Secondary | ICD-10-CM | POA: Insufficient documentation

## 2014-09-14 DIAGNOSIS — Z8719 Personal history of other diseases of the digestive system: Secondary | ICD-10-CM | POA: Insufficient documentation

## 2014-09-14 DIAGNOSIS — R0600 Dyspnea, unspecified: Secondary | ICD-10-CM

## 2014-09-14 DIAGNOSIS — J45909 Unspecified asthma, uncomplicated: Secondary | ICD-10-CM | POA: Insufficient documentation

## 2014-09-14 DIAGNOSIS — R000 Tachycardia, unspecified: Secondary | ICD-10-CM | POA: Insufficient documentation

## 2014-09-14 MED ORDER — PREDNISONE 20 MG PO TABS: 60.0000 mg | ORAL_TABLET | Freq: Every day | ORAL | Status: AC

## 2014-09-14 MED ORDER — ALBUTEROL SULFATE HFA 108 (90 BASE) MCG/ACT IN AERS: 2.0000 | INHALATION_SPRAY | Freq: Four times a day (QID) | RESPIRATORY_TRACT | Status: DC | PRN

## 2014-09-14 MED ORDER — ALBUTEROL (5 MG/ML) CONTINUOUS INHALATION SOLN
10.0000 mg/h | INHALATION_SOLUTION | RESPIRATORY_TRACT | Status: AC
  Administered 2014-09-14: 10 mg/h via RESPIRATORY_TRACT
  Filled 2014-09-14: qty 20

## 2014-09-14 MED ORDER — PREDNISONE 20 MG PO TABS
60.0000 mg | ORAL_TABLET | ORAL | Status: AC
  Administered 2014-09-14: 60 mg via ORAL
  Filled 2014-09-14: qty 3

## 2014-09-14 NOTE — ED Notes (Signed)
MD at bedside. EDP [place] PRESENT TO EVALUATE THIS PT 

## 2014-09-14 NOTE — ED Notes (Signed)
Per pt, states increased asthma symptoms which started yesterday-rescue inhaler not effective

## 2014-09-14 NOTE — ED Notes (Signed)
[facility]

## 2014-09-14 NOTE — ED Provider Notes (Signed)
CSN: [id number]     Arrival date & time 09/14/14  [id number] History   First MD Initiated Contact with Patient 09/14/14 [phone]     Chief Complaint  Patient presents with  . Asthma     (Consider location/radiation/quality/duration/timing/severity/associated sxs/prior Treatment) HPI  She presents with new dyspnea, mild cough. Symptoms began yesterday.  Since onset symptoms have not improved in spite of using home inhalers. Concurrent fever or chills, though the patient describes cough, occasional chest pressure consistent with prior episodes of pneumonia.   Past Medical History  Diagnosis Date  . Asthma     uses rescue inhaler as need-will bring dos  . GERD (gastroesophageal reflux disease)     only with pregnancy-uses tums prn  . Crohn's disease   . Abnormal antibody titer     Positive Antibody KELL  . [facility]   Past Surgical History  Procedure Laterality Date  . Cesarean section    . Nasal endoscopy    . Lithotripsy  2005  . Colonoscopy    . Cesarean section  10/19/2011    Procedure: CESAREAN SECTION;  Surgeon: Roseanna RainbowLisa A Jackson-Moore, MD;  Location: [facility];  Service: Gynecology;  Laterality: N/A;   No family history on file. History  Substance Use Topics  . Smoking status: Former Smoker -- 7 years    Types: Cigarettes    Quit date: 03/16/2011  . Smokeless tobacco: Not on file  . Alcohol Use: No   OB History   Grav Para Term Preterm Abortions TAB SAB Ect Mult Living   4 3 3  1 1    3      Review of Systems  Constitutional:       Per HPI, otherwise negative  HENT:       Per HPI, otherwise negative  Respiratory:       Per HPI, otherwise negative  Cardiovascular:       Per HPI, otherwise negative  Gastrointestinal: Negative for vomiting.  Endocrine:       Negative aside from HPI  Genitourinary:       Neg aside from HPI   Musculoskeletal:       Per HPI, otherwise negative  Skin: Negative.   Neurological: Negative for syncope.       Allergies  Remicade  Home Medications   Prior to Admission medications   Medication Sig Start Date End Date Taking? Authorizing Provider  acetaminophen (TYLENOL) 500 MG tablet Take 500 mg by mouth every 6 (six) hours as needed for pain.    Historical Provider, MD  albuterol (PROVENTIL HFA;VENTOLIN HFA) 108 (90 BASE) MCG/ACT inhaler Inhale 2 puffs into the lungs every 6 (six) hours as needed. Shortness of breath       Historical Provider, MD  albuterol (PROVENTIL) (2.5 MG/3ML) 0.083% nebulizer solution Take 3 mLs (2.5 mg total) by nebulization every 6 (six) hours as needed for wheezing. 11/11/12   Toy BakerAnthony T Allen, MD  Mesalamine (ASACOL HD) 800 MG TBEC Take 1 tablet by mouth daily.    Historical Provider, MD  nitrofurantoin, macrocrystal-monohydrate, (MACROBID) 100 MG capsule Take 1 capsule (100 mg total) by mouth 2 (two) times daily. X 7 days 12/15/13   Phill MutterPeter S Dammen, PA-C   BP 122/69  Pulse 84  Temp(Src) 98.5 F (36.9 C) (Oral)  Resp 16  SpO2 99% Physical Exam  Nursing note and vitals reviewed. Constitutional: She is oriented to person, place, and time. She appears well-developed and well-nourished. No  distress.  HENT:  Head: Normocephalic and atraumatic.  Eyes: Conjunctivae and EOM are normal.  Cardiovascular: Normal rate and regular rhythm.   Pulmonary/Chest: No stridor. Tachypnea noted. No respiratory distress. She has decreased breath sounds. She has wheezes.  Abdominal: She exhibits no distension.  Musculoskeletal: She exhibits no edema.  Neurological: She is alert and oriented to person, place, and time. No cranial nerve deficit.  Skin: Skin is warm and dry.  Psychiatric: She has a normal mood and affect.    ED Course  Procedures (including critical care time) Labs Review Labs Reviewed - No data to display  Imaging Review Dg Chest 2 View  09/14/2014   CLINICAL DATA:  Shortness of breath and chest pain  EXAM: CHEST  2 VIEW  COMPARISON:  11/11/2012   FINDINGS: The heart size and mediastinal contours are within normal limits. Both lungs are clear. The visualized skeletal structures are unremarkable.  IMPRESSION: No active cardiopulmonary disease.   Electronically Signed   By: Ruel Favors M.D.   On: 09/14/2014 11:57   Update: Patient is substantially improved, ready for d/c.   MDM   Final diagnoses:  Dyspnea    Patient presents with dyspnea, has history of asthma, and has no evidence for pneumonia. There is also low suspicion for ongoing coronary ischemia or thromboembolic processes given her age, absence of risk factors. With her progress and she was discharged with a short course of steroids and scheduled dilators.    Gerhard Munch, MD 09/14/14 [phone]

## 2014-09-14 NOTE — Discharge Instructions (Signed)
It is important that you follow up as soon as possible with your physician for continued management of your condition.  For the next two days please use your albuterol inhaler every four hours.  You may then transition to using the medicine.  If you develop any new, or concerning changes in your condition, please return to the emergency department immediately.

## 2014-09-14 NOTE — ED Notes (Signed)
Patient transported to X-ray 

## 2014-09-14 NOTE — ED Notes (Signed)
RRT KENNETH AT [facility]

## 2014-09-20 ENCOUNTER — Encounter (HOSPITAL_BASED_OUTPATIENT_CLINIC_OR_DEPARTMENT_OTHER): Payer: Self-pay | Admitting: Emergency Medicine

## 2014-09-20 ENCOUNTER — Inpatient Hospital Stay (HOSPITAL_BASED_OUTPATIENT_CLINIC_OR_DEPARTMENT_OTHER)
Admission: EM | Admit: 2014-09-20 | Discharge: 2014-09-21 | DRG: 202 | Disposition: A | Discharge status: Home / Self Care | Payer: Medicaid Other | Attending: Internal Medicine | Admitting: Internal Medicine

## 2014-09-20 DIAGNOSIS — J9621 Acute and chronic respiratory failure with hypoxia: Secondary | ICD-10-CM

## 2014-09-20 DIAGNOSIS — Z23 Encounter for immunization: Secondary | ICD-10-CM

## 2014-09-20 DIAGNOSIS — J45909 Unspecified asthma, uncomplicated: Secondary | ICD-10-CM | POA: Diagnosis present

## 2014-09-20 DIAGNOSIS — Z888 Allergy status to other drugs, medicaments and biological substances status: Secondary | ICD-10-CM

## 2014-09-20 DIAGNOSIS — K219 Gastro-esophageal reflux disease without esophagitis: Secondary | ICD-10-CM | POA: Diagnosis present

## 2014-09-20 DIAGNOSIS — Z87891 Personal history of nicotine dependence: Secondary | ICD-10-CM

## 2014-09-20 DIAGNOSIS — K5090 Crohn's disease, unspecified, without complications: Secondary | ICD-10-CM | POA: Diagnosis present

## 2014-09-20 DIAGNOSIS — J9601 Acute respiratory failure with hypoxia: Secondary | ICD-10-CM | POA: Diagnosis present

## 2014-09-20 DIAGNOSIS — D72829 Elevated white blood cell count, unspecified: Secondary | ICD-10-CM | POA: Diagnosis present

## 2014-09-20 DIAGNOSIS — J45901 Unspecified asthma with (acute) exacerbation: Principal | ICD-10-CM | POA: Diagnosis present

## 2014-09-20 LAB — CBC
HCT: 37.9 % (ref 36.0–46.0)
Hemoglobin: 12.9 g/dL (ref 12.0–15.0)
MCH: 31 pg (ref 26.0–34.0)
MCHC: 34 g/dL (ref 30.0–36.0)
MCV: 91.1 fL (ref 78.0–100.0)
PLATELETS: 221 10*3/uL (ref 150–400)
RBC: 4.16 MIL/uL (ref 3.87–5.11)
RDW: 12.6 % (ref 11.5–15.5)
WBC: 14.1 10*3/uL — ABNORMAL HIGH (ref 4.0–10.5)

## 2014-09-20 LAB — BASIC METABOLIC PANEL
ANION GAP: 15 (ref 5–15)
BUN: 11 mg/dL (ref 6–23)
CO2: 24 meq/L (ref 19–32)
Calcium: 9.4 mg/dL (ref 8.4–10.5)
Chloride: 101 mEq/L (ref 96–112)
Creatinine, Ser: 0.8 mg/dL (ref 0.50–1.10)
GFR calc Af Amer: >90 mL/min (ref >90)
Glucose, Bld: 127 mg/dL — ABNORMAL HIGH (ref 70–99)
Potassium: 3.7 mEq/L (ref 3.7–5.3)
Sodium: 140 mEq/L (ref 137–147)

## 2014-09-20 MED ORDER — ALBUTEROL (5 MG/ML) CONTINUOUS INHALATION SOLN
INHALATION_SOLUTION | RESPIRATORY_TRACT | Status: AC
  Administered 2014-09-20: 15 mg/h
  Filled 2014-09-20: qty 20

## 2014-09-20 MED ORDER — ONDANSETRON HCL 4 MG/2ML IJ SOLN: 4.0000 mg | Freq: Four times a day (QID) | INTRAMUSCULAR | Status: DC | PRN

## 2014-09-20 MED ORDER — HYDROCODONE-ACETAMINOPHEN 5-325 MG PO TABS
1.0000 | ORAL_TABLET | ORAL | Status: DC | PRN
  Administered 2014-09-20 – 2014-09-21 (×3): 1 via ORAL
  Filled 2014-09-20 (×3): qty 1

## 2014-09-20 MED ORDER — METHYLPREDNISOLONE SODIUM SUCC 125 MG IJ SOLR
60.0000 mg | Freq: Every day | INTRAMUSCULAR | Status: DC
  Administered 2014-09-20 – 2014-09-21 (×2): 60 mg via INTRAVENOUS
  Filled 2014-09-20 (×2): qty 0.96

## 2014-09-20 MED ORDER — GUAIFENESIN-DM 100-10 MG/5ML PO SYRP
5.0000 mL | ORAL_SOLUTION | ORAL | Status: DC | PRN
  Administered 2014-09-20 – 2014-09-21 (×3): 5 mL via ORAL
  Filled 2014-09-20 (×3): qty 10

## 2014-09-20 MED ORDER — SODIUM CHLORIDE 0.9 % IV SOLN: INTRAVENOUS | Status: DC

## 2014-09-20 MED ORDER — ACETAMINOPHEN 650 MG RE SUPP
650.0000 mg | Freq: Four times a day (QID) | RECTAL | Status: DC | PRN
Start: 2014-09-20 — End: 2014-09-21

## 2014-09-20 MED ORDER — ALBUTEROL SULFATE (2.5 MG/3ML) 0.083% IN NEBU: 5.0000 mg | INHALATION_SOLUTION | RESPIRATORY_TRACT | Status: DC

## 2014-09-20 MED ORDER — IPRATROPIUM-ALBUTEROL 0.5-2.5 (3) MG/3ML IN SOLN
3.0000 mL | RESPIRATORY_TRACT | Status: DC | PRN
  Filled 2014-09-20: qty 3

## 2014-09-20 MED ORDER — IPRATROPIUM BROMIDE 0.02 % IN SOLN: 0.5000 mg | RESPIRATORY_TRACT | Status: DC | PRN

## 2014-09-20 MED ORDER — ALBUTEROL SULFATE (2.5 MG/3ML) 0.083% IN NEBU: 5.0000 mg | INHALATION_SOLUTION | RESPIRATORY_TRACT | Status: DC | PRN

## 2014-09-20 MED ORDER — ALBUTEROL SULFATE (2.5 MG/3ML) 0.083% IN NEBU
INHALATION_SOLUTION | RESPIRATORY_TRACT | Status: AC
  Filled 2014-09-20: qty 6

## 2014-09-20 MED ORDER — ALBUTEROL SULFATE (2.5 MG/3ML) 0.083% IN NEBU
INHALATION_SOLUTION | RESPIRATORY_TRACT | Status: AC
  Administered 2014-09-20: 5 mg via RESPIRATORY_TRACT
  Filled 2014-09-20: qty 6

## 2014-09-20 MED ORDER — ONDANSETRON HCL 4 MG PO TABS: 4.0000 mg | ORAL_TABLET | Freq: Four times a day (QID) | ORAL | Status: DC | PRN

## 2014-09-20 MED ORDER — ALBUTEROL SULFATE (2.5 MG/3ML) 0.083% IN NEBU
5.0000 mg | INHALATION_SOLUTION | Freq: Once | RESPIRATORY_TRACT | Status: AC
Start: 2014-09-20 — End: 2014-09-20
  Administered 2014-09-20: 5 mg via RESPIRATORY_TRACT

## 2014-09-20 MED ORDER — INFLUENZA VAC SPLIT QUAD 0.5 ML IM SUSY
0.5000 mL | PREFILLED_SYRINGE | INTRAMUSCULAR | Status: AC
  Administered 2014-09-21: 0.5 mL via INTRAMUSCULAR
  Filled 2014-09-20 (×2): qty 0.5

## 2014-09-20 MED ORDER — ACETAMINOPHEN 325 MG PO TABS: 650.0000 mg | ORAL_TABLET | Freq: Four times a day (QID) | ORAL | Status: DC | PRN

## 2014-09-20 MED ORDER — IPRATROPIUM-ALBUTEROL 0.5-2.5 (3) MG/3ML IN SOLN
3.0000 mL | RESPIRATORY_TRACT | Status: DC
Start: 2014-09-20 — End: 2014-09-21
  Administered 2014-09-20 – 2014-09-21 (×7): 3 mL via RESPIRATORY_TRACT
  Filled 2014-09-20 (×7): qty 3

## 2014-09-20 MED ORDER — IPRATROPIUM BROMIDE 0.02 % IN SOLN: 0.5000 mg | RESPIRATORY_TRACT | Status: DC

## 2014-09-20 MED ORDER — IPRATROPIUM BROMIDE 0.02 % IN SOLN
0.5000 mg | Freq: Once | RESPIRATORY_TRACT | Status: AC
  Administered 2014-09-20: 0.5 mg via RESPIRATORY_TRACT
  Filled 2014-09-20: qty 2.5

## 2014-09-20 MED ORDER — IPRATROPIUM BROMIDE 0.02 % IN SOLN
0.5000 mg | Freq: Once | RESPIRATORY_TRACT | Status: AC
  Administered 2014-09-20: 0.5 mg via RESPIRATORY_TRACT

## 2014-09-20 MED ORDER — PREDNISONE 50 MG PO TABS
60.0000 mg | ORAL_TABLET | Freq: Once | ORAL | Status: AC
  Administered 2014-09-20: 60 mg via ORAL
  Filled 2014-09-20 (×2): qty 1

## 2014-09-20 MED ORDER — IPRATROPIUM BROMIDE 0.02 % IN SOLN
RESPIRATORY_TRACT | Status: AC
  Administered 2014-09-20: 0.5 mg via RESPIRATORY_TRACT
  Filled 2014-09-20: qty 2.5

## 2014-09-20 NOTE — H&P (Signed)
[facility] History and Physical  Jeanella CrazeJennifer M Spicer RUE:[id number] DOB: 1981-06-29 DOA: 09/20/2014  Referring physician: ER physician PCP: No PCP Per Patient   Chief Complaint: Shortness of breath  HPI:  33 year old female with past medical history of asthma under good control, recently seen in ED 4 days prior to this admission when she was treated for asthma attack with nebulizer treatments and prednisone and subsequently discharged home. She was taking prednisone as prescribed by shortness of breath continued to get worse along with nonproductive cough. Patient reported audible wheezing, unable to control the cough. No fevers or chills. She has occasional chest discomfort with coughing but otherwise no chest pains. No palpitations. No abdominal pain, nausea, vomiting. No reports of diarrhea or constipation. No reports of blood in the stool or urine. In ED, vital signs are stable. She had oxygen saturation of 91% on 2 L nasal cannula oxygen support. Blood work was significant for leukocytosis of 14.1 otherwise unremarkable. [facility] management of asthma exacerbation.  Assessment & Plan    Principal Problem:   Acute respiratory failure with hypoxia /  acute asthma exacerbation   Acute hypoxic respiratory failure likely secondary to acute asthma exacerbation.   Patient is saturating 98% on 2 L nasal cannula oxygen support.   Order placed for DuoNeb nebulizer treatment every 4 hours scheduled and every 2 hours as needed for shortness of breath or wheezing.   Start Solu-Medrol 60 mg IV daily.   Continue oxygen support via nasal cannula to keep oxygen saturation above 90%.   May use guaifenesin as needed for cough. Active Problems:   Leukocytosis    Likely reflective of steroid use. No signs of an acute infection.   DVT prophylaxis:   SCDs bilaterally  Radiological Exams on Admission: No results found.   Code Status: Full Family  Communication: Plan of care discussed with the patient  Disposition Plan: Admit for further evaluation  Manson PasseyEVINE, ALMA, MD  [facility] Pager [phone]  Review of Systems:  Constitutional: Negative for fever, chills and malaise/fatigue. Negative for diaphoresis.  HENT: Negative for hearing loss, ear pain, nosebleeds, congestion, sore throat, neck pain, tinnitus and ear discharge.   Eyes: Negative for blurred vision, double vision, photophobia, pain, discharge and redness.  Respiratory: As per history of present illness.   Cardiovascular: Negative for palpitations, orthopnea, claudication and leg swelling.  Gastrointestinal: Negative for nausea, vomiting and abdominal pain. Negative for heartburn, constipation, blood in stool and melena.  Genitourinary: Negative for dysuria, urgency, frequency, hematuria and flank pain.  Musculoskeletal: Negative for myalgias, back pain, joint pain and falls.  Skin: Negative for itching and rash.  Neurological: Negative for dizziness and weakness. Negative for tingling, tremors, sensory change, speech change, focal weakness, loss of consciousness and headaches.  Endo/Heme/Allergies: Negative for environmental allergies and polydipsia. Does not bruise/bleed easily.  Psychiatric/Behavioral: Negative for suicidal ideas. The patient is not nervous/anxious.      Past Medical History  Diagnosis Date  . Asthma     uses rescue inhaler as need-will bring dos  . GERD (gastroesophageal reflux disease)     only with pregnancy-uses tums prn  . Crohn's disease   . Abnormal antibody titer     Positive Antibody KELL  . [facility]     [facility]   Past Surgical History  Procedure Laterality Date  . Cesarean section    . Nasal endoscopy    . Lithotripsy  2005  . Colonoscopy    .  Cesarean section  10/19/2011    Procedure: CESAREAN SECTION;  Surgeon: Roseanna Rainbow, MD;  Location: [facility];  Service: Gynecology;  Laterality: N/A;   Social  History:  reports that she quit smoking about 3 years ago. Her smoking use included Cigarettes. She smoked 0.00 packs per day for 7 years. She does not have any smokeless tobacco history on file. She reports that she does not drink alcohol or use illicit drugs.  Allergies  Allergen Reactions  . Remicade [Infliximab] Shortness Of Breath    Sob, heart racing during infusion    Family History: Hypertension in family   Prior to Admission medications   Medication Sig Start Date End Date Taking? Authorizing Provider  acetaminophen (TYLENOL) 500 MG tablet Take 500 mg by mouth every 6 (six) hours as needed for pain.   Yes Historical Provider, MD  albuterol (PROVENTIL HFA;VENTOLIN HFA) 108 (90 BASE) MCG/ACT inhaler Inhale 2 puffs into the lungs every 6 (six) hours as needed. Shortness of breath 09/14/14  Yes Gerhard Munch, MD  albuterol (PROVENTIL) (2.5 MG/3ML) 0.083% nebulizer solution Take 3 mLs (2.5 mg total) by nebulization every 6 (six) hours as needed for wheezing. 11/11/12  Yes Toy Baker, MD  naphazoline-glycerin (CLEAR EYES) 0.012-0.2 % SOLN Place 1-2 drops into both eyes every 4 (four) hours as needed for irritation.   Yes Historical Provider, MD   Physical Exam: Filed Vitals:   09/20/14 8119 09/20/14 0646 09/20/14 0755 09/20/14 0858  BP: 148/77 148/77 123/75 115/61  Pulse: 91 79 95 84  Temp: 97.8 F (36.6 C)  98.6 F (37 C) 98 F (36.7 C)  TempSrc:   Oral Oral  Resp: 22  18 18   Height:      Weight:      SpO2: 91% 92% 100% 98%    Physical Exam  Constitutional: Appears well-developed and well-nourished. No distress.  HENT: Normocephalic. No tonsillar erythema or exudates Eyes: Conjunctivae and EOM are normal. PERRLA, no scleral icterus.  Neck: Normal ROM. Neck supple. No JVD. No tracheal deviation. No thyromegaly.  CVS: RRR, S1/S2 +, no murmurs, no gallops, no carotid bruit.  Pulmonary: Wheezing and upper lung lobes, no stridor, no crackles or rhonchi.  Abdominal: Soft.  BS +,  no distension, tenderness, rebound or guarding.  Musculoskeletal: Normal range of motion. No edema and no tenderness.  Lymphadenopathy: No lymphadenopathy noted, cervical, inguinal. Neuro: Alert. Normal reflexes, muscle tone coordination. No focal neurologic deficits. Skin: Skin is warm and dry. No rash noted. Not diaphoretic. No erythema. No pallor.  Psychiatric: Normal mood and affect. Behavior, judgment, thought content normal.   Labs on Admission:  Basic Metabolic Panel:  Recent Labs Lab 09/20/14 0600  NA 140  K 3.7  CL 101  CO2 24  GLUCOSE 127*  BUN 11  CREATININE 0.80  CALCIUM 9.4   Liver Function Tests: No results found for this basename: AST, ALT, ALKPHOS, BILITOT, PROT, ALBUMIN,  in the last 168 hours No results found for this basename: LIPASE, AMYLASE,  in the last 168 hours No results found for this basename: AMMONIA,  in the last 168 hours CBC:  Recent Labs Lab 09/20/14 0600  WBC 14.1*  HGB 12.9  HCT 37.9  MCV 91.1  PLT 221   Cardiac Enzymes: No results found for this basename: CKTOTAL, CKMB, CKMBINDEX, TROPONINI,  in the last 168 hours BNP: No components found with this basename: POCBNP,  CBG: No results found for this basename: GLUCAP,  in the last 168 hours  If 7PM-7AM, please contact night-coverage [email or web address] Password [email or web address] 09/20/2014, 9:49 AM

## 2014-09-20 NOTE — ED Notes (Signed)
Cellphone, clothes, pocketbook, shoes, wallet, earrings

## 2014-09-20 NOTE — ED Notes (Signed)
Report given to [facility] for 1510

## 2014-09-20 NOTE — Care Management Note (Addendum)
    Page 1 of 2   09/21/2014     12:46:07 PM CARE MANAGEMENT NOTE 09/21/2014  Patient:  TYQUASIA, BELYEA   Account Number:  [id number]  Date Initiated:  09/20/2014  Documentation initiated by:  [facility]  Subjective/Objective Assessment:   33 Y/O F ADMITTED W/ASTHMA EXAC.     Action/Plan:   FROM HOME.   Anticipated DC Date:  09/21/2014   Anticipated DC Plan:  HOME/SELF CARE      DC Planning Services  CM consult  MATCH Program  Medical [facility]      Choice offered to / List presented to:             Status of service:  Completed, signed off Medicare Important Message given?   (If response is "NO", the following Medicare IM given date fields will be blank) Date Medicare IM given:   Medicare IM given by:   Date Additional Medicare IM given:   Additional Medicare IM given by:    Discharge Disposition:  HOME/SELF CARE  Per UR Regulation:  Reviewed for med. necessity/level of care/duration of stay  If discussed at Long Length of Stay Meetings, dates discussed:    Comments:  09/21/14 Niambi Smoak RN,BSN NCM [phone] CONIRMED W/ADMIT-BONNIE-PATIENT DOES NOT HAVE MEDICAID FOR HERSELF, SHE HAS MEDICAID FOR HER KIDS.NO INSURANCE.PATIENT WILL GO TO DSS TO RE CERTIFY HER MEDICAID.QUALIFIES FOR MATCH PROGRAM:INFORMED HER OF POLICY-1XUSE W/IN 12 CAL MONTHS/NO NARCOTICS,$3 CO PAY(SHE CAN AFFORD)/SELECT PHARMACIES.PATIENT VOICED UNDERSTANDING.MEDS PREDNISIONE PAK, ALBUTEROL INHALER TOTAL COST $20-SHE SAYS SHE CANNOT AFFORD,WILL USE MATCH PROGRAM.[facility] SPOKE TO [facility]-THEY HAVE THE MEDS.PATIENT WILL GO THERE AFTER SHE GETS HER CAR.SHE ALSO STATES HER NEB MACINE IS IN A BOX THAT SHE CANNOT FIND.INFORMED HER TO SEARCH FOR IT,SINCE IT IS LESS THAN 20YRS OLD FROM [facility],& IF IT IS NOT LOST & NOT REPORTED TO POLICE DEPT,SHE IS RESPONSIBLE.PATIENT VOICED UNDERSTANDING.MD UPDATED. NO FURTHER D/C NEEDS.  09/20/14 Damir Leung RN,BSN NCM [phone] NO ANTICIPATED D/C NEEDS.

## 2014-09-20 NOTE — ED Notes (Signed)
Pt reports SOB awaken from sleep this AM

## 2014-09-20 NOTE — Progress Notes (Signed)
UR completed 

## 2014-09-20 NOTE — ED Notes (Signed)
Pts SATS maintaining at 91%. Placed on 2 lt nasal cannula. SATS 96%

## 2014-09-20 NOTE — ED Notes (Signed)
Report called to Chris with [facility] 

## 2014-09-20 NOTE — ED Notes (Signed)
Labs drawn with iv start

## 2014-09-20 NOTE — ED Notes (Signed)
[facility] here at this time. 

## 2014-09-20 NOTE — ED Provider Notes (Signed)
CSN: [id number]     Arrival date & time 09/20/14  0300 History   First MD Initiated Contact with Patient 09/20/14 0324     Chief Complaint  Patient presents with  . Shortness of Breath     (Consider location/radiation/quality/duration/timing/severity/associated sxs/prior Treatment) HPI Pt presents with c/o shortness of breath and wheezing.  Pt has hx of asthma.  Was seen in the ED on 9/29 and diagnosed with asthma exacerbation.  She has completed 5 day course of prednisone, states she has been using her albuterol frequently.  Tonight while trying to sleep was having increased difficulty breathing which prompted ED visit.  No fever/chills.  No leg swelling or chest pain.  Pt has hx of admission for asthma, but no hx of intubations.  There are no other associated systemic symptoms, there are no other alleviating or modifying factors.   Past Medical History  Diagnosis Date  . Asthma     uses rescue inhaler as need-will bring dos  . GERD (gastroesophageal reflux disease)     only with pregnancy-uses tums prn  . Crohn's disease   . Abnormal antibody titer     Positive Antibody KELL  . Blood transfusion     at [facility]   Past Surgical History  Procedure Laterality Date  . Cesarean section    . Nasal endoscopy    . Lithotripsy  2005  . Colonoscopy    . Cesarean section  10/19/2011    Procedure: CESAREAN SECTION;  Surgeon: Roseanna Rainbow, MD;  Location: [facility];  Service: Gynecology;  Laterality: N/A;   History reviewed. No pertinent family history. History  Substance Use Topics  . Smoking status: Former Smoker -- 7 years    Types: Cigarettes    Quit date: 03/16/2011  . Smokeless tobacco: Not on file  . Alcohol Use: No   OB History   Grav Para Term Preterm Abortions TAB SAB Ect Mult Living   4 3 3  1 1    3      Review of Systems ROS reviewed and all otherwise negative except for mentioned in HPI    Allergies  Remicade  Home Medications   Prior to Admission  medications   Medication Sig Start Date End Date Taking? Authorizing Provider  acetaminophen (TYLENOL) 500 MG tablet Take 500 mg by mouth every 6 (six) hours as needed for pain.    Historical Provider, MD  albuterol (PROVENTIL HFA;VENTOLIN HFA) 108 (90 BASE) MCG/ACT inhaler Inhale 2 puffs into the lungs every 6 (six) hours as needed. Shortness of breath 09/14/14   Gerhard Munch, MD  albuterol (PROVENTIL) (2.5 MG/3ML) 0.083% nebulizer solution Take 3 mLs (2.5 mg total) by nebulization every 6 (six) hours as needed for wheezing. 11/11/12   Toy Baker, MD  naphazoline-glycerin (CLEAR EYES) 0.012-0.2 % SOLN Place 1-2 drops into both eyes every 4 (four) hours as needed for irritation.    Historical Provider, MD   BP 148/77  Pulse 79  Temp(Src) 97.8 F (36.6 C) (Oral)  Resp 22  Ht 5\' 5"  (1.651 m)  Wt 213 lb 9 oz (96.871 kg)  BMI 35.54 kg/m2  SpO2 92%  LMP 09/07/2014 Vitals reviewed Physical Exam Physical Examination: General appearance - alert, well appearing, and in no distress Mental status - alert, oriented to person, place, and time Eyes - no conjunctival injection, no scleral icterus Mouth - mucous membranes moist, pharynx normal without lesions Chest - BSS, bilateral wheezing, decreased air movement, speaking in full sentences, no  increased respiratory effor Heart - normal rate, regular rhythm, normal S1, S2, no murmurs, rubs, clicks or gallops Abdomen - soft, nontender, nondistended, no masses or organomegaly Extremities - peripheral pulses normal, no pedal edema, no clubbing or cyanosis Skin - normal coloration and turgor, no rashes  ED Course  Procedures (including critical care time)  4:05 AM pt is improving after first neb, will continue treatments and re-assess.   5:24 AM pt feeling improved after continuous neb.  She still has bilateral wheezing, improved air movement.  She was offered admission but would prefer to continue treatment here in the hopes of avoiding  admission.    5:51 AM pt is now agreeable to obs admission.  Will obtain baseline labs  6:51 AM pt placed on [place] O2 due to sats maintaining at 91-92%  6:59 AM d/w Dr. Julian ReilGardner, [facility], pt [facility] are more beds available there per [facility].    CRITICAL CARE Performed by: Ethelda ChickLINKER,MARTHA K Total critical care time: 40 Critical care time was exclusive of separately billable procedures and treating other patients. Critical care was necessary to treat or prevent imminent or life-threatening deterioration. Critical care was time spent personally by me on the following activities: development of treatment plan with patient and/or surrogate as well as nursing, discussions with consultants, evaluation of patient's response to treatment, examination of patient, obtaining history from patient or surrogate, ordering and performing treatments and interventions, ordering and review of laboratory studies, ordering and review of radiographic studies, pulse oximetry and re-evaluation of patient's condition. Labs Review Labs Reviewed  CBC - Abnormal; Notable for the following:    WBC 14.1 (*)    All other components within normal limits  BASIC METABOLIC PANEL - Abnormal; Notable for the following:    Glucose, Bld 127 (*)    All other components within normal limits    Imaging Review No results found.   EKG Interpretation None      MDM   Final diagnoses:  Asthma exacerbation    Pt presenting with c/o wheezing and shortness of breath.  She has failed outpatient treatment with po steroid course.  Pt given prednisone 60mg  here in the ED.  duonebs and one continuous neb.  She has improved significantly with decreased wheezing but wheezing persists, and O2 sat 91% on RA at rest.  Pt is agreeable with admission at this time for further treatment. Labs show leukocytosis which is likely related to steroids, pt had reassuring CXR 4 days ago in the ED,  no fever to suggest pneumonia at this time so elected not to repeat CXR tonight.      Ethelda ChickMartha K Linker, MD 09/20/14 [phone]

## 2014-09-21 LAB — COMPREHENSIVE METABOLIC PANEL
ALT: 10 U/L (ref 0–35)
AST: 7 U/L (ref 0–37)
Albumin: 3.5 g/dL (ref 3.5–5.2)
Alkaline Phosphatase: 61 U/L (ref 39–117)
Anion gap: 11 (ref 5–15)
BUN: 11 mg/dL (ref 6–23)
CALCIUM: 9.6 mg/dL (ref 8.4–10.5)
CO2: 24 mEq/L (ref 19–32)
Chloride: 101 mEq/L (ref 96–112)
Creatinine, Ser: 0.75 mg/dL (ref 0.50–1.10)
GFR calc Af Amer: >90 mL/min (ref >90)
GFR calc non Af Amer: >90 mL/min (ref >90)
Glucose, Bld: 114 mg/dL — ABNORMAL HIGH (ref 70–99)
POTASSIUM: 4.2 meq/L (ref 3.7–5.3)
Sodium: 136 mEq/L — ABNORMAL LOW (ref 137–147)
TOTAL PROTEIN: 7.1 g/dL (ref 6.0–8.3)
Total Bilirubin: <0.2 mg/dL — ABNORMAL LOW (ref 0.3–1.2)

## 2014-09-21 LAB — APTT: aPTT: 28 seconds (ref 24–37)

## 2014-09-21 LAB — GLUCOSE, CAPILLARY: Glucose-Capillary: 124 mg/dL — ABNORMAL HIGH (ref 70–99)

## 2014-09-21 LAB — CBC
HEMATOCRIT: 40 % (ref 36.0–46.0)
HEMOGLOBIN: 13.5 g/dL (ref 12.0–15.0)
MCH: 31 pg (ref 26.0–34.0)
MCHC: 33.8 g/dL (ref 30.0–36.0)
MCV: 92 fL (ref 78.0–100.0)
Platelets: 259 10*3/uL (ref 150–400)
RBC: 4.35 MIL/uL (ref 3.87–5.11)
RDW: 12.6 % (ref 11.5–15.5)
WBC: 22.4 10*3/uL — ABNORMAL HIGH (ref 4.0–10.5)

## 2014-09-21 LAB — PROTIME-INR
INR: 1.05 (ref 0.00–1.49)
Prothrombin Time: 13.8 seconds (ref 11.6–15.2)

## 2014-09-21 MED ORDER — ALBUTEROL SULFATE (2.5 MG/3ML) 0.083% IN NEBU: 3.0000 mL | INHALATION_SOLUTION | RESPIRATORY_TRACT | Status: DC | PRN

## 2014-09-21 MED ORDER — METHYLPREDNISOLONE 4 MG PO KIT: PACK | ORAL | Status: DC

## 2014-09-21 MED ORDER — ALBUTEROL SULFATE HFA 108 (90 BASE) MCG/ACT IN AERS: 2.0000 | INHALATION_SPRAY | Freq: Four times a day (QID) | RESPIRATORY_TRACT | Status: DC | PRN

## 2014-09-21 MED ORDER — HEPARIN SODIUM (PORCINE) 5000 UNIT/ML IJ SOLN
5000.0000 [IU] | Freq: Three times a day (TID) | INTRAMUSCULAR | Status: DC
  Administered 2014-09-21: 5000 [IU] via SUBCUTANEOUS
  Filled 2014-09-21 (×4): qty 1

## 2014-09-21 NOTE — Discharge Summary (Signed)
Jeanella CrazeJennifer M Moralez, 33 y.o., DOB 08/09/1981, MRN [id number]. Admission date: 09/20/2014 Discharge Date 09/21/2014 Primary MD No PCP Per Patient Admitting Physician Alison MurrayAlma M Devine, MD  Admission Diagnosis  Asthma exacerbation [J45.901]  Discharge Diagnosis   Principal Problem:   Acute respiratory failure with hypoxia Active Problems:   Asthma exacerbation   Leukocytosis      Past Medical History  Diagnosis Date  . Asthma     uses rescue inhaler as need-will bring dos  . GERD (gastroesophageal reflux disease)     only with pregnancy-uses tums prn  . Crohn's disease   . Abnormal antibody titer     Positive Antibody KELL  . Blood transfusion     [facility]    Past Surgical History  Procedure Laterality Date  . Cesarean section    . Nasal endoscopy    . Lithotripsy  2005  . Colonoscopy    . Cesarean section  10/19/2011    Procedure: CESAREAN SECTION;  Surgeon: Roseanna RainbowLisa A Jackson-Moore, MD;  Location: [facility];  Service: Gynecology;  Laterality: N/A;   33 year old female with past medical history of asthma under good control, recently seen in ED 4 days prior to this admission when she was treated for asthma attack with nebulizer treatments and prednisone and subsequently discharged home. She was taking prednisone as prescribed by shortness of breath continued to get worse along with nonproductive cough. Patient reported audible wheezing, unable to control the cough. No fevers or chills. She has occasional chest discomfort with coughing but otherwise no chest pains. No palpitations. No abdominal pain, nausea, vomiting. No reports of diarrhea or constipation. No reports of blood in the stool or urine.  In ED, vital signs are stable. She had oxygen saturation of 91% on 2 L nasal cannula oxygen support. Blood work was significant for leukocytosis of 14.1 otherwise unremarkable. She was [facility] management of asthma exacerbation. She was started on IV Solu-Medrol, she had  significant improvement of her shortness of breath, saturating 98% on room air, no audible wheezing, and patient requested to go home, as well patient has worsening of leukocytosis of [id number] to 22,000 at day of discharge, she denies any fever, chills, dysuria, any productive sputum, cough has been improving, and this is related to her IV steroids.  Hospital Course See H&P, Labs, Consult and Test reports for all details in brief,   Principal Problem:   Acute respiratory failure with hypoxia Active Problems:   Asthma exacerbation   Leukocytosis  Acute respiratory failure with hypoxia /  Acute hypoxic respiratory failure likely secondary to acute asthma exacerbation.       Resolved, saturating 98% on room air.  acute asthma exacerbation  Patient is saturating 98% on room air.  Much improvement of her wheezing, no shortness of breath, and be discharged home on Medrol pack, continue with Advair nebulizer treatment, and as needed albuterol.  Leukocytosis  Patient is a febrile , no dysuria , no polyuria , no productive sputum , this is secondary to steroids , we'll need to follow up and repeat CBC as an outpatient within a week .     Significant Tests:  See full reports for all details    Dg Chest 2 View  09/14/2014   CLINICAL DATA:  Shortness of breath and chest pain  EXAM: CHEST  2 VIEW  COMPARISON:  11/11/2012  FINDINGS: The heart size and mediastinal contours are within normal limits. Both lungs are clear. The visualized skeletal  structures are unremarkable.  IMPRESSION: No active cardiopulmonary disease.   Electronically Signed   By: Ruel Favors M.D.   On: 09/14/2014 11:57     Today   Subjective:   Akeyla Muchnik today has no headache,no chest abdominal pain,no new weakness tingling or numbness, feels much better , and request to go to the.  Objective:   Blood pressure 128/92, pulse 69, temperature 98.3 F (36.8 C), temperature source Oral, resp. rate 18, height 5\' 5"  (1.651  m), weight 95.165 kg (209 lb 12.8 oz), last menstrual period 09/07/2014, SpO2 96.00%.  Intake/Output Summary (Last 24 hours) at 09/21/14 1033 Last data filed at 09/20/14 1848  Gross per 24 hour  Intake    840 ml  Output      0 ml  Net    840 ml    Exam Awake Alert, Oriented *3, No new F.N deficits, Normal affect [place].AT,PERRAL Supple Neck,No JVD, No cervical lymphadenopathy appriciated.  Symmetrical Chest wall movement, Good air movement bilaterally,  no use of accessory muscle , able to speak full sentences , very minimal scattered end expiratory wheeze . RRR,No Gallops,Rubs or new Murmurs, No Parasternal Heave +ve B.Sounds, Abd Soft, Non tender, No organomegaly appriciated, No rebound -guarding or rigidity. No Cyanosis, Clubbing or edema, No new Rash or bruise  Data Review     CBC w Diff: Lab Results  Component Value Date   WBC 22.4* 09/21/2014   HGB 13.5 09/21/2014   HCT 40.0 09/21/2014   PLT 259 09/21/2014   CMP: Lab Results  Component Value Date   NA 136* 09/21/2014   K 4.2 09/21/2014   CL 101 09/21/2014   CO2 24 09/21/2014   BUN 11 09/21/2014   CREATININE 0.75 09/21/2014   PROT 7.1 09/21/2014   ALBUMIN 3.5 09/21/2014   BILITOT <0.2* 09/21/2014   ALKPHOS 61 09/21/2014   AST 7 09/21/2014   ALT 10 09/21/2014  .  Micro Results No results found for this or any previous visit (from the past 240 hour(s)).   Discharge Instructions      follow with your PCP within 1 week repeat CBC, BMP, chest x-ray. Follow-up Information   Follow up with No PCP Per Patient In 1 week.   Specialty:  General Practice      Discharge Medications     Medication List         acetaminophen 500 MG tablet  Commonly known as:  TYLENOL  Take 500 mg by mouth every 6 (six) hours as needed for pain.     albuterol (2.5 MG/3ML) 0.083% nebulizer solution  Commonly known as:  PROVENTIL  Take 3 mLs (2.5 mg total) by nebulization every 6 (six) hours as needed for wheezing.     albuterol 108 (90 BASE)  MCG/ACT inhaler  Commonly known as:  PROVENTIL HFA;VENTOLIN HFA  Inhale 2 puffs into the lungs every 6 (six) hours as needed. Shortness of breath     methylPREDNISolone 4 MG tablet  Commonly known as:  MEDROL (PAK)  follow package directions     naphazoline-glycerin 0.012-0.2 % Soln  Commonly known as:  CLEAR EYES  Place 1-2 drops into both eyes every 4 (four) hours as needed for irritation.         Total Time in preparing paper work, data evaluation and todays exam - 35 minutes  Itamar Mcgowan M.D on 09/21/2014 at 10:33 AM  [facility] Office  [phone]

## 2014-09-21 NOTE — Discharge Instructions (Signed)
Follow with Primary MD in 7 days   Get CBC, CMP, 2 view Chest X ray checked  by Primary MD next visit.    Activity: As tolerated with Full fall precautions use walker/cane & assistance as needed   Disposition Home    Diet: Heart Healthy  ,   For Heart failure patients - Check your Weight same time everyday, if you gain over 2 pounds, or you develop in leg swelling, experience more shortness of breath or chest pain, call your Primary MD immediately. Follow Cardiac Low Salt Diet and 1.8 lit/day fluid restriction.   On your next visit with her primary care physician please Get Medicines reviewed and adjusted.  Please request your Prim.MD to go over all Hospital Tests and Procedure/Radiological results at the follow up, please get all Hospital records sent to your Prim MD by signing hospital release before you go home.   If you experience worsening of your admission symptoms, develop shortness of breath, life threatening emergency, suicidal or homicidal thoughts you must seek medical attention immediately by calling 911 or calling your MD immediately  if symptoms less severe.  You Must read complete instructions/literature along with all the possible adverse reactions/side effects for all the Medicines you take and that have been prescribed to you. Take any new Medicines after you have completely understood and accpet all the possible adverse reactions/side effects.   Do not drive, operating heavy machinery, perform activities at heights, swimming or participation in water activities or provide baby sitting services if your were admitted for syncope or siezures until you have seen by Primary MD or a Neurologist and advised to do so again.  Do not drive when taking Pain medications.    Do not take more than prescribed Pain, Sleep and Anxiety Medications  Special Instructions: If you have smoked or chewed Tobacco  in the last 2 yrs please stop smoking, stop any regular Alcohol  and or any  Recreational drug use.  Wear Seat belts while driving.   Please note  You were cared for by a hospitalist during your hospital stay. If you have any questions about your discharge medications or the care you received while you were in the hospital after you are discharged, you can call the unit and asked to speak with the hospitalist on call if the hospitalist that took care of you is not available. Once you are discharged, your primary care physician will handle any further medical issues. Please note that NO REFILLS for any discharge medications will be authorized once you are discharged, as it is imperative that you return to your primary care physician (or establish a relationship with a primary care physician if you do not have one) for your aftercare needs so that they can reassess your need for medications and monitor your lab values.

## 2014-10-18 ENCOUNTER — Encounter (HOSPITAL_BASED_OUTPATIENT_CLINIC_OR_DEPARTMENT_OTHER): Payer: Self-pay | Admitting: Emergency Medicine

## 2015-07-22 ENCOUNTER — Encounter (HOSPITAL_COMMUNITY): Payer: Self-pay | Admitting: *Deleted

## 2015-07-22 ENCOUNTER — Inpatient Hospital Stay (HOSPITAL_COMMUNITY)
Admission: AD | Admit: 2015-07-22 | Discharge: 2015-07-22 | Disposition: A | Discharge status: Home / Self Care | Payer: Medicaid Other | Source: Ambulatory Visit | Attending: Obstetrics & Gynecology | Admitting: Obstetrics & Gynecology

## 2015-07-22 DIAGNOSIS — Z3A22 22 weeks gestation of pregnancy: Secondary | ICD-10-CM | POA: Diagnosis not present

## 2015-07-22 DIAGNOSIS — Z87891 Personal history of nicotine dependence: Secondary | ICD-10-CM | POA: Insufficient documentation

## 2015-07-22 DIAGNOSIS — K5090 Crohn's disease, unspecified, without complications: Secondary | ICD-10-CM | POA: Diagnosis not present

## 2015-07-22 DIAGNOSIS — O0932 Supervision of pregnancy with insufficient antenatal care, second trimester: Secondary | ICD-10-CM | POA: Insufficient documentation

## 2015-07-22 DIAGNOSIS — K429 Umbilical hernia without obstruction or gangrene: Secondary | ICD-10-CM | POA: Insufficient documentation

## 2015-07-22 DIAGNOSIS — O9989 Other specified diseases and conditions complicating pregnancy, childbirth and the puerperium: Secondary | ICD-10-CM | POA: Diagnosis not present

## 2015-07-22 DIAGNOSIS — J45909 Unspecified asthma, uncomplicated: Secondary | ICD-10-CM | POA: Diagnosis not present

## 2015-07-22 DIAGNOSIS — O99512 Diseases of the respiratory system complicating pregnancy, second trimester: Secondary | ICD-10-CM | POA: Diagnosis not present

## 2015-07-22 HISTORY — DX: Calculus of kidney: N20.0

## 2015-07-22 MED ORDER — ALBUTEROL SULFATE HFA 108 (90 BASE) MCG/ACT IN AERS: 2.0000 | INHALATION_SPRAY | Freq: Four times a day (QID) | RESPIRATORY_TRACT | Status: DC | PRN

## 2015-07-22 NOTE — MAU Note (Signed)
Pt states she had U/S @ [facility] on 04/24/15 & was told she was [redacted]w[redacted]d.

## 2015-07-22 NOTE — MAU Note (Signed)
Pt is pregnant, ? 22 weeks, is not sure, has not had any PNC.  Noticed lump coming up on abdomen approx 2 months, has become bigger & uncomfortable.  Known hernia, did not have problems with this her last pregnancy.  Denies uc's, bleeding or LOF.

## 2015-07-22 NOTE — MAU Provider Note (Signed)
History     CSN: [id number]  Arrival date and time: 07/22/15 1745   First Provider Initiated Contact with Patient 07/22/15 1911     CC: lump in belly button  HPI Patient is 34 y.o. [id number] [redacted]w[redacted]d here with complaints of umbilical hernia. She has had this in prior pregnancies but it "seems worse this pregnancy" Denies pain, nausea, vomiting. She reports it is reducible and able to pushed in. Never becomes hard or painful.   Asthma- has outside of pregnancy. She is using albuterol 2+ times per week but more frequency now. She is not on controller medication  Pregnancy- seen at [facility] and dx with preg by TVUS ([redacted]w[redacted]d on 04/24/2015). She previously delivered with Dr Gaynell Face and Dr Tamela Oddi but has not yet established care this pregnancy.    +FM, denies LOF, VB, contractions, vaginal discharge.  OB History    Gravida Para Term Preterm AB TAB SAB Ectopic Multiple Living   5 3 3  1 1    3       Past Medical History  Diagnosis Date  . Asthma     uses rescue inhaler as need-will bring dos  . GERD (gastroesophageal reflux disease)     only with pregnancy-uses tums prn  . Crohn's disease   . Abnormal antibody titer     Positive Antibody KELL  . Blood transfusion     at [facility]  . Kidney stone     Past Surgical History  Procedure Laterality Date  . Cesarean section    . Nasal endoscopy    . Lithotripsy  2005  . Colonoscopy    . Cesarean section  10/19/2011    Procedure: CESAREAN SECTION;  Surgeon: Roseanna Rainbow, MD;  Location: [facility];  Service: Gynecology;  Laterality: N/A;    History reviewed. No pertinent family history.  History  Substance Use Topics  . Smoking status: Former Smoker -- 7 years    Types: Cigarettes    Quit date: 03/16/2011  . Smokeless tobacco: Never Used  . Alcohol Use: No    Allergies:  Allergies  Allergen Reactions  . Remicade [Infliximab] Shortness Of Breath    Sob, heart racing during infusion    Prescriptions prior to  admission  Medication Sig Dispense Refill Last Dose  . acetaminophen (TYLENOL) 500 MG tablet Take 500 mg by mouth every 6 (six) hours as needed for pain.   Past Month at Unknown time  . albuterol (PROVENTIL) (2.5 MG/3ML) 0.083% nebulizer solution Take 3 mLs (2.5 mg total) by nebulization every 6 (six) hours as needed for wheezing. 75 mL 12 Past Month at Unknown time  . methylPREDNISolone (MEDROL, PAK,) 4 MG tablet follow package directions 21 tablet 0   . naphazoline-glycerin (CLEAR EYES) 0.012-0.2 % SOLN Place 1-2 drops into both eyes every 4 (four) hours as needed for irritation.   Past Month at Unknown time  . [DISCONTINUED] albuterol (PROVENTIL HFA;VENTOLIN HFA) 108 (90 BASE) MCG/ACT inhaler Inhale 2 puffs into the lungs every 6 (six) hours as needed. Shortness of breath 6.7 g 0     Review of Systems  Constitutional: Negative for fever and chills.  Eyes: Negative for blurred vision and double vision.  Respiratory: Positive for shortness of breath (using inhaler). Negative for cough.   Cardiovascular: Negative for chest pain and orthopnea.  Gastrointestinal: Negative for nausea and vomiting.  Genitourinary: Negative for dysuria, frequency and flank pain.  Musculoskeletal: Negative for myalgias.  Skin: Negative for rash.  Neurological: Negative for  dizziness, tingling, weakness and headaches.  Endo/Heme/Allergies: Does not bruise/bleed easily.  Psychiatric/Behavioral: Negative for depression and suicidal ideas. The patient is not nervous/anxious.    Physical Exam   Blood pressure 97/58, pulse 86, temperature 98 F (36.7 C), temperature source Oral, resp. rate 18.  Physical Exam  Constitutional: She is oriented to person, place, and time. She appears well-developed and well-nourished. No distress.  Pregnant female  HENT:  Head: Normocephalic and atraumatic.  Eyes: Conjunctivae are normal. No scleral icterus.  Neck: Normal range of motion. Neck supple.  Cardiovascular: Normal rate  and intact distal pulses.   Respiratory: Effort normal. She exhibits no tenderness.  GI: Soft. There is no tenderness. There is no rebound and no guarding.  Gravid, Fundus just above umbilicus.  Protuberant hernia present superior to umbilicus. Fascial defect palpated.  Able to easily reduce bowel into fascia efect. Non-tender to palpation  Genitourinary: Vagina normal.  Musculoskeletal: Normal range of motion. She exhibits no edema.  Neurological: She is alert and oriented to person, place, and time.  Skin: Skin is warm and dry. No rash noted.  Psychiatric: She has a normal mood and affect.    MAU Course  Procedures  MDM FHR 158  Assessment and Plan  SAMMANTHA MEHLHAFF is a 34 y.o.. G5P3013 at [redacted]w[redacted]d here with concern for umbilical hernia and without any prenatal care #umbilical hernia: Not concerning exam, easily reduced.No cocner for incarcerated hernia. Discussed reasons to return and s/sx of incarcerated hernia  #late entry to [facility]: Pregnancy dated by [redacted]w[redacted]d [place] at [facility] -ordered outpatient anatomy US. -Provided pregnancy verification letter -contacted clinic manager to help set up [facility].   #asthma: counseled on need for controller med (like Qvar) but patient refuses to take this. She was provided with a refill of her MDI   #h/o Chron's #h/o Anti Kell: likely will need regular [place] follow up.    Isa Rankin [facility] 07/22/2015, 7:39 PM

## 2015-08-01 ENCOUNTER — Encounter: Payer: Medicaid Other | Admitting: Family Medicine

## 2015-08-01 ENCOUNTER — Encounter: Payer: Self-pay | Admitting: Family Medicine

## 2015-08-02 ENCOUNTER — Other Ambulatory Visit (HOSPITAL_COMMUNITY): Payer: Self-pay | Admitting: Family Medicine

## 2015-08-02 DIAGNOSIS — O0932 Supervision of pregnancy with insufficient antenatal care, second trimester: Secondary | ICD-10-CM

## 2015-08-02 DIAGNOSIS — Z1389 Encounter for screening for other disorder: Secondary | ICD-10-CM

## 2015-08-02 DIAGNOSIS — O09522 Supervision of elderly multigravida, second trimester: Secondary | ICD-10-CM

## 2015-08-04 ENCOUNTER — Ambulatory Visit (HOSPITAL_COMMUNITY): Admission: RE | Admit: 2015-08-04 | Payer: Medicaid Other | Source: Ambulatory Visit

## 2015-08-04 ENCOUNTER — Ambulatory Visit (HOSPITAL_COMMUNITY)
Admission: RE | Admit: 2015-08-04 | Discharge: 2015-08-04 | Disposition: A | Discharge status: Home / Self Care | Payer: Medicaid Other | Source: Ambulatory Visit | Attending: Family Medicine | Admitting: Family Medicine

## 2015-08-04 DIAGNOSIS — O34219 Maternal care for unspecified type scar from previous cesarean delivery: Secondary | ICD-10-CM

## 2015-08-04 DIAGNOSIS — Z36 Encounter for antenatal screening of mother: Secondary | ICD-10-CM | POA: Diagnosis not present

## 2015-08-04 DIAGNOSIS — J45909 Unspecified asthma, uncomplicated: Secondary | ICD-10-CM

## 2015-08-04 DIAGNOSIS — O09529 Supervision of elderly multigravida, unspecified trimester: Secondary | ICD-10-CM | POA: Insufficient documentation

## 2015-08-04 DIAGNOSIS — O09522 Supervision of elderly multigravida, second trimester: Secondary | ICD-10-CM | POA: Diagnosis not present

## 2015-08-04 DIAGNOSIS — O359XX1 Maternal care for (suspected) fetal abnormality and damage, unspecified, fetus 1: Secondary | ICD-10-CM

## 2015-08-04 DIAGNOSIS — Z363 Encounter for antenatal screening for malformations: Secondary | ICD-10-CM

## 2015-08-04 DIAGNOSIS — O0932 Supervision of pregnancy with insufficient antenatal care, second trimester: Secondary | ICD-10-CM | POA: Diagnosis present

## 2015-08-04 DIAGNOSIS — Z1389 Encounter for screening for other disorder: Secondary | ICD-10-CM

## 2015-08-04 DIAGNOSIS — O99519 Diseases of the respiratory system complicating pregnancy, unspecified trimester: Secondary | ICD-10-CM

## 2015-08-04 NOTE — Progress Notes (Signed)
Genetic Counseling  High-Risk Gestation Note  Appointment Date:  08/04/2015 Referred By: Federico Flake,* Date of Birth:  January 15, 1981   Pregnancy History: [id number] Estimated Date of Delivery: 11/19/15 Estimated Gestational Age: [redacted]w[redacted]d Attending: Rema Fendt, MD   Ms. Jamie Wilkinson was seen for genetic counseling because of a maternal age of 34 years old at delivery and ultrasound finding of absent nasal bone.  In Summary:  Ultrasound today visualized absent nasal bone  Patient will be 34 years old at delivery  Discussed adjusted risk for fetal Down syndrome is approximately 1 in 73  Patient elected to have NIPS (Panorama) and declined amniocentesis today     She was counseled regarding maternal age and the association with risk for chromosome conditions due to nondisjunction with aging of the ova.   We reviewed chromosomes, nondisjunction, and the associated 1 in 141 risk for fetal aneuploidy related to a maternal age of 34 y.o. at [redacted]w[redacted]d gestation.  She was counseled that the risk for aneuploidy decreases as gestational age increases, accounting for those pregnancies which spontaneously abort.  We specifically discussed Down syndrome (trisomy 16), trisomies 26 and 59, and sex chromosome aneuploidies (47,XXX and 47,XXY) including the common features and prognoses of each.   Ultrasound performed today visualized absent nasal bone. Remaining visualized fetal anatomy appeared normal. Complete ultrasound results reported separately.   We discussed that the second trimester genetic sonogram is targeted at identifying features associated with aneuploidy.  It has evolved as a screening tool used to provide an individualized risk assessment for Down syndrome and other trisomies.  The ability of sonography to aid in the detection of aneuploidies relies on identification of both major structural anomalies and "soft markers."  The patient was counseled that the latter term refers to  findings that are often normal variants and do not cause any significant medical problems.  Nonetheless, these markers have a known association with aneuploidy.    Ms. ,Jamie Wilkinson was counseled that a hypoplastic or absent nasal bone is a highly sensitive and specific marker for Down syndrome.  It is present in approximately 1% of chromosomally normal fetuses, but up to 70% of fetuses with Down syndrome, 55 % with trisomy 18, and 34% with trisomy 13. The nasal bone is typically considered to be hypoplastic if it is less than 2.5 mm in length.  The length of the fetal nasal bone varies by race and ethnicity in second trimester fetuses.  The associated risk of aneuploidy is highest in Caucasians, with a LR of ~50. The likelihood ratio in the African American population is approximately 8.5.   Given Jamie Wilkinson's maternal age, we discussed the approximate 1 in 60  risk for fetal Down syndrome.  Considering the ultrasound finding of a hypoplastic nasal bone, the adjusted risk for fetal Down syndrome is ~1 in 32 (3%).   We reviewed the available screening option of noninvasive prenatal screening (NIPS)/cell free DNA (cfDNA) testing.  She was counseled that screening tests are used to modify a patient's a priori risk for aneuploidy, typically based on age. This estimate provides a pregnancy specific risk assessment. We reviewed the benefits and limitations of each option. Specifically, we discussed the conditions for which the test screens, the detection rates, and false positive rates of each. She was also counseled regarding diagnostic testing via amniocentesis. We reviewed the approximate 1 in 300-500 risk for complications for amniocentesis, including spontaneous pregnancy loss. After consideration of all the options, she elected to proceed with NIPS today (  Panorama through [facility]).  Those results will be available in 8-10 days.  She declined amniocentesis at this time. She understands that screening  tests cannot rule out all birth defects or genetic syndromes. The patient was advised of this limitation and states she still does not want additional testing at this time.   Family history information was not obtained today given the nature of today's discussion and given that the patient was not originally scheduled for genetic counseling, thus, leading to time constraints for today's visit. Without further information regarding the provided family history, an accurate genetic risk cannot be calculated. Further genetic counseling is warranted if more information is obtained.  I counseled Jamie Wilkinson regarding the above risks and available options.  The approximate face-to-face time with the genetic counselor was 20 minutes.  Quinn Plowman, MS,  Certified Genetic Counselor 08/04/2015

## 2015-08-08 DIAGNOSIS — O99519 Diseases of the respiratory system complicating pregnancy, unspecified trimester: Secondary | ICD-10-CM

## 2015-08-08 DIAGNOSIS — O09522 Supervision of elderly multigravida, second trimester: Secondary | ICD-10-CM | POA: Insufficient documentation

## 2015-08-08 DIAGNOSIS — J45909 Unspecified asthma, uncomplicated: Secondary | ICD-10-CM | POA: Insufficient documentation

## 2015-08-08 DIAGNOSIS — O359XX0 Maternal care for (suspected) fetal abnormality and damage, unspecified, not applicable or unspecified: Secondary | ICD-10-CM | POA: Insufficient documentation

## 2015-08-08 DIAGNOSIS — O34219 Maternal care for unspecified type scar from previous cesarean delivery: Secondary | ICD-10-CM | POA: Insufficient documentation

## 2015-08-11 ENCOUNTER — Telehealth (HOSPITAL_COMMUNITY): Payer: Self-pay | Admitting: MS"

## 2015-08-11 ENCOUNTER — Other Ambulatory Visit (HOSPITAL_COMMUNITY): Payer: Self-pay | Admitting: Family Medicine

## 2015-08-11 NOTE — Telephone Encounter (Signed)
Called Jamie Wilkinson to discuss her cell free DNA test results.  Ms. SHARRA ZERANGUE had Panorama testing through [facility].  Testing was offered because of advanced maternal age and absent nasal bone.   The patient was identified by name and DOB.  We reviewed that these are within normal limits, showing a less than 1 in 10,000 risk for trisomies 21, 18 and 13, and monosomy X (Turner syndrome).  In addition, the risk for triploidy/vanishing twin and sex chromosome trisomies (47,XXX and 47,XXY) was also low risk.  We reviewed that this testing identifies > 99% of pregnancies with trisomy 43, trisomy 26, sex chromosome trisomies (47,XXX and 47,XXY), and triploidy. The detection rate for trisomy 18 is 96%.  The detection rate for monosomy X is ~92%.  The false positive rate is <0.1% for all conditions. Testing was also consistent with female fetal sex. She understands that this testing does not identify all genetic conditions.  All questions were answered to her satisfaction, she was encouraged to call with additional questions or concerns.  Quinn Plowman, MS Certified Genetic Counselor 08/11/2015

## 2015-08-11 NOTE — Telephone Encounter (Signed)
Attempted to call patient regarding normal noninvasive prenatal screening result. Left message for patient to return call.   Jamie Wilkinson 08/11/2015 9:23 AM

## 2015-08-17 ENCOUNTER — Ambulatory Visit (INDEPENDENT_AMBULATORY_CARE_PROVIDER_SITE_OTHER): Payer: Medicaid Other | Admitting: Advanced Practice Midwife

## 2015-08-17 ENCOUNTER — Encounter: Payer: Self-pay | Admitting: Advanced Practice Midwife

## 2015-08-17 ENCOUNTER — Other Ambulatory Visit (HOSPITAL_COMMUNITY)
Admission: RE | Admit: 2015-08-17 | Discharge: 2015-08-17 | Disposition: A | Discharge status: Home / Self Care | Payer: Medicaid Other | Source: Ambulatory Visit | Attending: Advanced Practice Midwife | Admitting: Advanced Practice Midwife

## 2015-08-17 VITALS — BP 112/71 | HR 85 | Temp 98.4°F | Wt 209.1 lb

## 2015-08-17 DIAGNOSIS — O359XX1 Maternal care for (suspected) fetal abnormality and damage, unspecified, fetus 1: Secondary | ICD-10-CM | POA: Diagnosis not present

## 2015-08-17 DIAGNOSIS — Z118 Encounter for screening for other infectious and parasitic diseases: Secondary | ICD-10-CM | POA: Diagnosis not present

## 2015-08-17 DIAGNOSIS — O3421 Maternal care for scar from previous cesarean delivery: Secondary | ICD-10-CM | POA: Diagnosis not present

## 2015-08-17 DIAGNOSIS — Z124 Encounter for screening for malignant neoplasm of cervix: Secondary | ICD-10-CM | POA: Diagnosis not present

## 2015-08-17 DIAGNOSIS — O34219 Maternal care for unspecified type scar from previous cesarean delivery: Secondary | ICD-10-CM

## 2015-08-17 DIAGNOSIS — Z23 Encounter for immunization: Secondary | ICD-10-CM | POA: Diagnosis not present

## 2015-08-17 DIAGNOSIS — Z1151 Encounter for screening for human papillomavirus (HPV): Secondary | ICD-10-CM

## 2015-08-17 DIAGNOSIS — Z113 Encounter for screening for infections with a predominantly sexual mode of transmission: Secondary | ICD-10-CM | POA: Insufficient documentation

## 2015-08-17 DIAGNOSIS — O09522 Supervision of elderly multigravida, second trimester: Secondary | ICD-10-CM | POA: Diagnosis not present

## 2015-08-17 DIAGNOSIS — Z01419 Encounter for gynecological examination (general) (routine) without abnormal findings: Secondary | ICD-10-CM | POA: Diagnosis present

## 2015-08-17 LAB — POCT URINALYSIS DIP (DEVICE)
Bilirubin Urine: NEGATIVE
GLUCOSE, UA: NEGATIVE mg/dL
Ketones, ur: NEGATIVE mg/dL
Leukocytes, UA: NEGATIVE
NITRITE: NEGATIVE
PH: 6.5 (ref 5.0–8.0)
PROTEIN: NEGATIVE mg/dL
Specific Gravity, Urine: 1.02 (ref 1.005–1.030)
UROBILINOGEN UA: 0.2 mg/dL (ref 0.0–1.0)

## 2015-08-17 MED ORDER — TETANUS-DIPHTH-ACELL PERTUSSIS 5-2.5-18.5 LF-MCG/0.5 IM SUSP
0.5000 mL | Freq: Once | INTRAMUSCULAR | Status: AC
  Administered 2015-08-17: 0.5 mL via INTRAMUSCULAR

## 2015-08-17 MED ORDER — CONCEPT OB 130-92.4-1 MG PO CAPS: 1.0000 | ORAL_CAPSULE | Freq: Every day | ORAL | Status: DC

## 2015-08-17 NOTE — Progress Notes (Signed)
Breastfeeding tip of the week reviewed Initial prenatal visit. New OB labs with 1hr GTT Urine trace hgb TDap and Flu New OB and 28 wk education pack given

## 2015-08-17 NOTE — Progress Notes (Signed)
Subjective:    Jamie Wilkinson is a [id number] [redacted]w[redacted]d by 10 week [place] being seen today for her first obstetrical visit. No PNC this pregnancy. Her obstetrical history is significant for advanced maternal age and previous C/S x 3. Patient does intend to breast feed. Pregnancy history fully reviewed.  Had anatomy scan ordered prior to NOB appt. It showed absent nasal bone. Genetic counseling done. NIPS normal.   Patient reports no bleeding, no contractions, no leaking and possible hernia. No pain, reducible .  Filed Vitals:   08/17/15 0815  BP: 112/71  Pulse: 85  Temp: 98.4 F (36.9 C)  Weight: 209 lb 1.6 oz (94.847 kg)    HISTORY: OB History  Gravida Para Term Preterm AB SAB TAB Ectopic Multiple Living  # Outcome Date GA Lbr Len/2nd Weight Sex Delivery Anes PTL Lv  5 Current           4 Term 10/19/11 [redacted]w[redacted]d  6 lb 6.8 oz (2.915 kg) F CS-Vac Spinal  Y  3 Term 04/2006    Judie Petit CS-Unspec   Y  2 Term 11/2001    F CS-Unspec   Y  1 TAB              Past Medical History  Diagnosis Date  . Asthma     uses rescue inhaler as need-will bring dos  . GERD (gastroesophageal reflux disease)     only with pregnancy-uses tums prn  . Crohn's disease   . Abnormal antibody titer     Positive Antibody KELL  . Blood transfusion     at [facility]  . Kidney stone    Past Surgical History  Procedure Laterality Date  . Cesarean section    . Nasal endoscopy    . Lithotripsy  2005  . Colonoscopy    . Cesarean section  10/19/2011    Procedure: CESAREAN SECTION;  Surgeon: Roseanna Rainbow, MD;  Location: [facility];  Service: Gynecology;  Laterality: N/A;   Family History  Problem Relation Age of Onset  . Cancer Mother   . Diabetes Mother   . Heart disease Maternal Grandfather      Exam    Uterus:   fundal height 27 cm  Pelvic Exam:    Perineum: No Hemorrhoids, Normal Perineum   Vulva: normal, Bartholin's, Urethra, Skene's normal   Vagina:  normal mucosa, normal  discharge   pH: NA   Cervix: no bleeding following Pap, no cervical motion tenderness, no lesions and nulliparous appearance   Adnexa: normal adnexa and no mass, fullness, tenderness   Bony Pelvis: average  System: Breast:  normal appearance, no masses or tenderness, No nipple retraction or dimpling, No nipple discharge or bleeding   Skin: normal coloration and turgor, no rashes    Neurologic: oriented, grossly non-focal   Extremities: normal strength, tone, and muscle mass, tr edema   HEENT sclera clear, anicteric and thyroid without masses   Mouth/Teeth mucous membranes moist, pharynx normal without lesions and dental hygiene good   Neck supple   Cardiovascular: regular rate and rhythm, 1/6 systolic murmur   Respiratory:  appears well, vitals normal, no respiratory distress, acyanotic, normal RR, chest clear, no wheezing, crepitations, rhonchi, normal symmetric air entry   Abdomen: abnormal findings:  umbilical hernia and gravid, S=D   Urinary: urethral meatus normal      Assessment:    Pregnancy: [id number] Patient Active Problem List  Diagnosis Date Noted  . Suspected fetal anomaly, antepartum 08/08/2015  . Asthma affecting pregnancy, antepartum 08/08/2015  . Supervision of high risk multigravida in second trimester in patient 35 years or older at time of delivery 08/08/2015  . Uterine scar from previous cesarean delivery affecting pregnancy 08/08/2015  . Advanced maternal age in multigravida 08/04/2015  . Asthma exacerbation 09/20/2014     1. Supervision of high risk multigravida in second trimester in patient 35 years or older at time of delivery  - Prenat w/o A Vit-FeFum-FePo-FA (CONCEPT OB) 130-92.4-1 MG CAPS; Take 1 tablet by mouth daily.  Dispense: 30 capsule; Refill: 12 - Glucose Tolerance, 1 HR (50g) w/o Fasting - Prenatal Profile - Hemoglobinopathy evaluation - Culture, OB Urine - Prescript Monitor Profile(19) - GC/Chlamydia probe amp ([place])not at [facility]  2.  Suspected fetal anomaly, antepartum, fetus 1 -Normal NIPS -F/U US  3. Advanced maternal age in multigravida, second trimester   4. Uterine scar from previous cesarean delivery affecting pregnancy -Needs repeat C/S. Consent signed  5. Flu vaccine need  - Flu Vaccine QUAD 36+ mos IM; Standing - Flu Vaccine QUAD 36+ mos IM  6. Need for Tdap vaccination  - Tdap (BOOSTRIX) injection 0.5 mL; Inject 0.5 mLs into the muscle once.  7. Screening for cervical cancer  - Cytology - PAP   Plan:     Initial labs drawn, GTT. Prenatal vitamins. Problem list reviewed and updated. Genetic Screening discussed NIPS: Normal.  Ultrasound discussed; fetal survey: Absent nasal bone, incomplete. F/U ordered for 4 weeks. .  Follow up in 2 weeks. BTL consent   Dorathy Kinsman 08/17/2015

## 2015-08-17 NOTE — Patient Instructions (Signed)
Preterm Birth °Preterm birth is a birth that happens before 37 weeks of pregnancy. Most pregnancies last about 39-41 weeks. Every week in the womb is important and is beneficial to the health of the infant. Infants born before 37 weeks of pregnancy are at a higher risk for complications. Depending on when the infant was born, he or she may be: °· Late preterm. Born between 32 weeks and 37 weeks of pregnancy. °· Very preterm. Born at less than 32 weeks of pregnancy. °· Extremely preterm. Born at less than 25 weeks of pregnancy. °The earlier a baby is born, the more likely the child will have issues related to prematurity. Complications and problems that can be seen in infants born too early include: °· Problems breathing (respiratory distress syndrome). °· Low birth weight. °· Problems feeding. °· Sleeping problems. °· Yellowing of the skin (jaundice). °· Infections such as pneumonia.  °Babies born very preterm or extremely preterm are at risk for more serious medical issues. These include: °· More severe breathing issues. °· Eyesight issues. °· Brain development issues (intraventricular hemorrhage). °· Behavioral and emotional development issues. °· Growth and developmental delays. °· Cerebral palsy. °· Serious feeding or bowel complications (necrotizing enterocolitis). °CAUSES  °There are two broad categories of preterm birth. °· Spontaneous preterm birth. This is a birth resulting from preterm labor (not medically induced) or preterm premature rupture of membranes (PPROM). °· Indicated preterm birth. This is a birth resulting from labor being medically induced due to health, personal, or social reasons. °RISK FACTORS °Preterm birth may be related to certain medical conditions, lifestyle factors, or demographic factors encountered by the mother or fetus. °· Medical conditions include: °¨ Multiple gestations (twins, triplets, and so on). °¨ Infection. °¨ Diabetes. °¨ Heart disease. °¨ Kidney disease. °¨ Cervical or  uterine abnormalities. °¨ Being underweight. °¨ High blood pressure or preeclampsia. °¨ Premature rupture of membranes (PROM). °¨ Birth defects in the fetus. °· Lifestyle factors include: °¨ Poor prenatal care. °¨ Poor nutrition or anemia. °¨ Cigarette smoking. °¨ Consuming alcohol. °¨ High levels of stress and lack of social or emotional support. °¨ Exposure to chemical or environmental toxins. °¨ Substance abuse. °· Demographic factors include: °¨ African-American ethnicity. °¨ Age (younger than 18 or older than 35 years of age). °¨ Low socioeconomic status. °Women with a history of preterm labor or who become pregnant within 18 months of giving birth are also at increased risk for preterm birth. °DIAGNOSIS  °Your health care provider may request additional tests to diagnose underlying complications resulting from preterm birth. Tests on the infant may include: °· Physical exam. °· Blood tests. °· Chest X-rays. °· Heart-lung monitoring. °TREATMENT  °After birth, special care will be taken to assess any problems or complications for the infant. Supportive care will be provided for the infant. Treatment depends on what problems are present and any complications that develop. Some preterm infants are cared for in a neonatal intensive care unit. In general, care may include: °· Maintaining temperature and oxygen in a clear heated box (baby isolette). °· Monitoring the infant's heart rate, breathing, and level of oxygen in the blood. °· Monitoring for signs of infection and, if needed, giving IV antibiotic medicine. °· Inserting a feeding tube (nose, mouth) or giving IV nutrition if unable to feed. °· Inserting a breathing tube (ventilation). °· Respiration support (continuous positive airway pressure [CPAP] or oxygen).  °Treatment will change as the infant builds up strength and is able to breathe and eat on his or her   own. For some infants, no special treatment is necessary. Parents may be educated on the potential  health risks of prematurity to the infant. °HOME CARE INSTRUCTIONS °· Understand your infant's special conditions and needs. It may be reassuring to learn about infant CPR. °· Monitor your infant in the car seat until he or she grows and matures. Infant car seats can cause breathing difficulties for preterm infants. °· Keep your infant warm. Dress your infant in layers and keep him or her away from drafts, especially in cold months of the year. °· Wash your hands thoroughly after going to the bathroom or changing a diaper. Late preterm infants may be more prone to infection. °· Follow all your health care provider's instructions for providing support and care to your preterm infant. °· Get support from organizations and groups that understand your challenges. °· Follow up with your infant's health care provider as directed. °Prevention °There are some things you can do to help lower your risk of having a preterm infant in the future. These include: °· Good prenatal care throughout the entire pregnancy. See a health care provider regularly for advice and tests. °· Management of underlying medical conditions. °· Proper self-care and lifestyle changes. °· Proper diet and weight control. °· Watching for signs of various infections. °SEEK MEDICAL CARE IF: °· Your infant has feeding difficulties. °· Your infant has sleeping difficulties. °· Your infant has breathing difficulties. °· Your infant's skin starts to look yellow. °· Your infant shows signs of infection, such as a stuffy nose, fever, crying, or bluish color of the skin. °FOR MORE INFORMATION °[employer] of [employer]: www.marchofdimes.com °Prematur[employer]org: www.prematurity.org °Document Released: 02/23/2004 Document Revised: 09/23/2013 Document Reviewed: 07/02/2013 °ExitCare® Patient Information ©2015 [employer]. This information is not intended to replace advice given to you by your health care provider. Make sure you discuss any questions you have with your health  care provider. ° °

## 2015-08-17 NOTE — Progress Notes (Signed)
Rpt C-Section and BTL papers signed today

## 2015-08-18 LAB — CULTURE, OB URINE
COLONY COUNT: NO GROWTH
ORGANISM ID, BACTERIA: NO GROWTH

## 2015-08-18 LAB — GC/CHLAMYDIA PROBE AMP (~~LOC~~) NOT AT ARMC
CHLAMYDIA, DNA PROBE: NEGATIVE
Neisseria Gonorrhea: NEGATIVE

## 2015-08-18 LAB — GLUCOSE TOLERANCE, 1 HOUR (50G) W/O FASTING: GLUCOSE 1 HOUR GTT: 126 mg/dL (ref 70–140)

## 2015-08-18 LAB — CYTOLOGY - PAP

## 2015-08-19 LAB — HEMOGLOBINOPATHY EVALUATION
HEMOGLOBIN OTHER: 0 %
HGB A2 QUANT: 1.4 % — AB (ref 2.2–3.2)
HGB A: 98.6 % — AB (ref 96.8–97.8)
Hgb F Quant: 0 % (ref 0.0–2.0)
Hgb S Quant: 0 %

## 2015-08-19 LAB — PRENATAL PROFILE (SOLSTAS)
Antibody Screen: POSITIVE — AB
BASOS PCT: 0 % (ref 0–1)
Basophils Absolute: 0 10*3/uL (ref 0.0–0.1)
EOS PCT: 2 % (ref 0–5)
Eosinophils Absolute: 0.2 10*3/uL (ref 0.0–0.7)
HCT: 33.1 % — ABNORMAL LOW (ref 36.0–46.0)
HEMOGLOBIN: 11.2 g/dL — AB (ref 12.0–15.0)
HIV 1&2 Ab, 4th Generation: NONREACTIVE
Hepatitis B Surface Ag: NEGATIVE
Lymphocytes Relative: 27 % (ref 12–46)
Lymphs Abs: 2.9 10*3/uL (ref 0.7–4.0)
MCH: 30.7 pg (ref 26.0–34.0)
MCHC: 33.8 g/dL (ref 30.0–36.0)
MCV: 90.7 fL (ref 78.0–100.0)
MONO ABS: 0.6 10*3/uL (ref 0.1–1.0)
MPV: 11.7 fL (ref 8.6–12.4)
Monocytes Relative: 6 % (ref 3–12)
Neutro Abs: 7 10*3/uL (ref 1.7–7.7)
Neutrophils Relative %: 65 % (ref 43–77)
Platelets: 155 10*3/uL (ref 150–400)
RBC: 3.65 MIL/uL — AB (ref 3.87–5.11)
RDW: 13.3 % (ref 11.5–15.5)
RH TYPE: POSITIVE
Rubella: 2.12 Index — ABNORMAL HIGH (ref <0.90)
WBC: 10.7 10*3/uL — AB (ref 4.0–10.5)

## 2015-08-19 LAB — ANTIBODY TITER (PRENATAL TITER): Ab Titer: 64

## 2015-08-19 LAB — PRENATAL ANTIBODY IDENTIFICATION

## 2015-08-19 LAB — BLOOD TYPING, RBC ANTIGENS: Antigen Typing: NEGATIVE

## 2015-08-22 DIAGNOSIS — O361920 Maternal care for other isoimmunization, second trimester, not applicable or unspecified: Secondary | ICD-10-CM | POA: Insufficient documentation

## 2015-08-22 LAB — CANNABANOIDS (GC/LC/MS), URINE: THC-COOH UR CONFIRM: 56 ng/mL — AB (ref <5)

## 2015-08-23 LAB — PRESCRIPTION MONITORING PROFILE (19 PANEL)
Amphetamine/Meth: NEGATIVE ng/mL
Barbiturate Screen, Urine: NEGATIVE ng/mL
Benzodiazepine Screen, Urine: NEGATIVE ng/mL
Buprenorphine, Urine: NEGATIVE ng/mL
COCAINE METABOLITES: NEGATIVE ng/mL
Carisoprodol, Urine: NEGATIVE ng/mL
Creatinine, Urine: 58.52 mg/dL (ref >20.0)
FENTANYL URINE: NEGATIVE ng/mL
MDMA URINE: NEGATIVE ng/mL
MEPERIDINE UR: NEGATIVE ng/mL
METHADONE SCREEN, URINE: NEGATIVE ng/mL
Methaqualone: NEGATIVE ng/mL
Nitrites, Initial: NEGATIVE ug/mL
OXYCODONE SCRN UR: NEGATIVE ng/mL
Opiate Screen, Urine: NEGATIVE ng/mL
PHENCYCLIDINE, UR: NEGATIVE ng/mL
Propoxyphene: NEGATIVE ng/mL
Tapentadol, urine: NEGATIVE ng/mL
Tramadol Scrn, Ur: NEGATIVE ng/mL
Zolpidem, Urine: NEGATIVE ng/mL
pH, Initial: 6.8 pH (ref 4.5–8.9)

## 2015-08-24 ENCOUNTER — Encounter: Payer: Medicaid Other | Admitting: Obstetrics and Gynecology

## 2015-09-01 ENCOUNTER — Ambulatory Visit (HOSPITAL_COMMUNITY)
Admission: RE | Admit: 2015-09-01 | Discharge: 2015-09-01 | Disposition: A | Discharge status: Home / Self Care | Payer: Medicaid Other | Source: Ambulatory Visit | Attending: Family Medicine | Admitting: Family Medicine

## 2015-09-01 ENCOUNTER — Encounter (HOSPITAL_COMMUNITY): Payer: Self-pay

## 2015-09-01 VITALS — BP 121/71 | HR 85

## 2015-09-01 DIAGNOSIS — O34219 Maternal care for unspecified type scar from previous cesarean delivery: Secondary | ICD-10-CM

## 2015-09-01 DIAGNOSIS — J45909 Unspecified asthma, uncomplicated: Secondary | ICD-10-CM

## 2015-09-01 DIAGNOSIS — O09522 Supervision of elderly multigravida, second trimester: Secondary | ICD-10-CM | POA: Diagnosis not present

## 2015-09-01 DIAGNOSIS — O359XX1 Maternal care for (suspected) fetal abnormality and damage, unspecified, fetus 1: Secondary | ICD-10-CM

## 2015-09-01 DIAGNOSIS — O99519 Diseases of the respiratory system complicating pregnancy, unspecified trimester: Secondary | ICD-10-CM

## 2015-09-02 ENCOUNTER — Encounter: Payer: Self-pay | Admitting: *Deleted

## 2015-09-08 ENCOUNTER — Encounter (HOSPITAL_COMMUNITY): Payer: Self-pay | Admitting: *Deleted

## 2015-09-08 ENCOUNTER — Inpatient Hospital Stay (HOSPITAL_COMMUNITY)
Admission: AD | Admit: 2015-09-08 | Discharge: 2015-09-08 | Disposition: A | Discharge status: Home / Self Care | Payer: Medicaid Other | Source: Ambulatory Visit | Attending: Obstetrics and Gynecology | Admitting: Obstetrics and Gynecology

## 2015-09-08 ENCOUNTER — Encounter: Payer: Medicaid Other | Admitting: Family Medicine

## 2015-09-08 DIAGNOSIS — O26893 Other specified pregnancy related conditions, third trimester: Secondary | ICD-10-CM | POA: Diagnosis not present

## 2015-09-08 DIAGNOSIS — J4521 Mild intermittent asthma with (acute) exacerbation: Secondary | ICD-10-CM

## 2015-09-08 DIAGNOSIS — Z3A29 29 weeks gestation of pregnancy: Secondary | ICD-10-CM | POA: Insufficient documentation

## 2015-09-08 DIAGNOSIS — O9989 Other specified diseases and conditions complicating pregnancy, childbirth and the puerperium: Secondary | ICD-10-CM

## 2015-09-08 DIAGNOSIS — J45909 Unspecified asthma, uncomplicated: Secondary | ICD-10-CM | POA: Diagnosis not present

## 2015-09-08 DIAGNOSIS — O34219 Maternal care for unspecified type scar from previous cesarean delivery: Secondary | ICD-10-CM

## 2015-09-08 DIAGNOSIS — O09522 Supervision of elderly multigravida, second trimester: Secondary | ICD-10-CM

## 2015-09-08 DIAGNOSIS — O359XX1 Maternal care for (suspected) fetal abnormality and damage, unspecified, fetus 1: Secondary | ICD-10-CM

## 2015-09-08 DIAGNOSIS — O99519 Diseases of the respiratory system complicating pregnancy, unspecified trimester: Secondary | ICD-10-CM

## 2015-09-08 MED ORDER — IPRATROPIUM-ALBUTEROL 0.5-2.5 (3) MG/3ML IN SOLN
3.0000 mL | Freq: Once | RESPIRATORY_TRACT | Status: AC
  Administered 2015-09-08: 3 mL via RESPIRATORY_TRACT
  Filled 2015-09-08: qty 3

## 2015-09-08 MED ORDER — IPRATROPIUM-ALBUTEROL 0.5-2.5 (3) MG/3ML IN SOLN: 3.0000 mL | Freq: Four times a day (QID) | RESPIRATORY_TRACT | Status: DC | PRN

## 2015-09-08 NOTE — MAU Note (Signed)
HAS ASTHMA  -  HAS USED INHALER   AT HOME -  NOT  WORKING      .  IN TRIAGE  -  PT  WHEEZING     02 SAT -  99.       [facility]-  CLINIC.    BABY  ALL  GOOD

## 2015-09-08 NOTE — MAU Provider Note (Signed)
First Provider Initiated Contact with Patient 09/08/15 2109      Chief Complaint:    Jamie Wilkinson is  34 y.o. [id number] at [redacted]w[redacted]d presents complaining wheezing.  She has run out of her nebulizer tx at home.    Obstetrical/Gynecological History: OB History    Gravida Para Term Preterm AB TAB SAB Ectopic Multiple Living   Past Medical History: Past Medical History  Diagnosis Date  . Asthma     uses rescue inhaler as need-will bring dos  . GERD (gastroesophageal reflux disease)     only with pregnancy-uses tums prn  . Crohn's disease   . Abnormal antibody titer     Positive Antibody KELL  . Blood transfusion     at [facility]  . Kidney stone     Past Surgical History: Past Surgical History  Procedure Laterality Date  . Cesarean section    . Nasal endoscopy    . Lithotripsy  2005  . Colonoscopy    . Cesarean section  10/19/2011    Procedure: CESAREAN SECTION;  Surgeon: Roseanna Rainbow, MD;  Location: [facility];  Service: Gynecology;  Laterality: N/A;    Family History: Family History  Problem Relation Age of Onset  . Cancer Mother   . Diabetes Mother   . Heart disease Maternal Grandfather     Social History: Social History  Substance Use Topics  . Smoking status: Former Smoker -- 7 years    Types: Cigarettes    Quit date: 03/16/2011  . Smokeless tobacco: Never Used  . Alcohol Use: No    Allergies:  Allergies  Allergen Reactions  . Remicade [Infliximab] Shortness Of Breath    Sob, heart racing during infusion    Meds:  Prescriptions prior to admission  Medication Sig Dispense Refill Last Dose  . acetaminophen (TYLENOL) 500 MG tablet Take 1,000 mg by mouth every 6 (six) hours as needed for moderate pain.    09/08/2015 at 0400  . albuterol (PROVENTIL HFA;VENTOLIN HFA) 108 (90 BASE) MCG/ACT inhaler Inhale 2 puffs into the lungs every 6 (six) hours as needed. Shortness of breath 1 Inhaler 2 09/08/2015 at 1800  .  Hyprom-Naphaz-Polysorb-Zn Sulf (CLEAR EYES COMPLETE) SOLN Place 1 drop into both eyes daily as needed (dry eyes).   09/07/2015 at Unknown time  . Prenat w/o A Vit-FeFum-FePo-FA (CONCEPT OB) 130-92.4-1 MG CAPS Take 1 tablet by mouth daily. (Patient not taking: Reported on 09/08/2015) 30 capsule 12 Not Taking at Unknown time    Review of Systems   Constitutional: Negative for fever and chills Eyes: Negative for visual disturbances Respiratory: Feels SOB, wheezing Cardiovascular: Negative for chest pain or palpitations  Gastrointestinal: Negative for vomiting, diarrhea and constipation Genitourinary: Negative for dysuria and urgency Musculoskeletal: Negative for back pain, joint pain, myalgias.  Normal ROM  Neurological: Negative for dizziness and headaches    Physical Exam  Blood pressure 93/57, pulse 94, temperature 98.1 F (36.7 C), temperature source Oral, resp. rate 24, height  (1.626 m), weight 96.163 kg (212 lb), SpO2 100 %. GENERAL: Well-developed, well-nourished female in no acute distress.  LUNGS: Exp wheezes bilaterallly.  After neb tx, Clear to auscultation bilaterally.  HEART: Regular rate and rhythm. ABDOMEN: Soft, nontender, nondistended, gravid.  EXTREMITIES: Nontender, no edema, 2+ distal pulses. DTR's 2+ FHT:  Baseline rate 140 bpm   Variability moderate  Accelerations present, reassuring for GA Decelerations none Contractions: Every 0  mins   Labs: No results found for this or any previous visit (from the past 24 hour(s)). Imaging Studies:  [place] [facility] Ob Follow Up  09/01/2015   OBSTETRICAL ULTRASOUND: This exam was performed within a [facility]. The OB US report was generated in the [facility] system, and faxed to the ordering physician.   This report is available in the [employer]. See the [facility] Obstetric US report via the Image Link.   Assessment: Jamie Wilkinson is  34 y.o. [phone] at [redacted]w[redacted]d presents with asthma, wheezing, resolved with Neb  Tx.  Plan: Rx refills of neb tx solution  CRESENZO-DISHMAN,FRANCES 9/22/20169:19 PM  \

## 2015-09-14 ENCOUNTER — Encounter: Payer: Medicaid Other | Admitting: Advanced Practice Midwife

## 2015-09-14 ENCOUNTER — Telehealth: Payer: Self-pay | Admitting: Advanced Practice Midwife

## 2015-09-14 NOTE — Telephone Encounter (Signed)
Called patient about missed appointment. Left a voicemail to reschedule appointment.

## 2015-10-13 ENCOUNTER — Encounter: Payer: Medicaid Other | Admitting: Obstetrics & Gynecology

## 2015-10-18 ENCOUNTER — Other Ambulatory Visit (HOSPITAL_COMMUNITY)
Admission: RE | Admit: 2015-10-18 | Discharge: 2015-10-18 | Disposition: A | Discharge status: Home / Self Care | Payer: Medicaid Other | Source: Ambulatory Visit | Attending: Obstetrics and Gynecology | Admitting: Obstetrics and Gynecology

## 2015-10-18 ENCOUNTER — Ambulatory Visit (INDEPENDENT_AMBULATORY_CARE_PROVIDER_SITE_OTHER): Payer: Medicaid Other | Admitting: Obstetrics and Gynecology

## 2015-10-18 VITALS — BP 116/75 | HR 85 | Temp 98.3°F | Wt 218.0 lb

## 2015-10-18 DIAGNOSIS — O9989 Other specified diseases and conditions complicating pregnancy, childbirth and the puerperium: Secondary | ICD-10-CM

## 2015-10-18 DIAGNOSIS — O09523 Supervision of elderly multigravida, third trimester: Secondary | ICD-10-CM | POA: Diagnosis not present

## 2015-10-18 DIAGNOSIS — Z113 Encounter for screening for infections with a predominantly sexual mode of transmission: Secondary | ICD-10-CM

## 2015-10-18 DIAGNOSIS — J45909 Unspecified asthma, uncomplicated: Secondary | ICD-10-CM | POA: Diagnosis not present

## 2015-10-18 DIAGNOSIS — O99519 Diseases of the respiratory system complicating pregnancy, unspecified trimester: Secondary | ICD-10-CM

## 2015-10-18 LAB — POCT URINALYSIS DIP (DEVICE)
Bilirubin Urine: NEGATIVE
Glucose, UA: NEGATIVE mg/dL
HGB URINE DIPSTICK: NEGATIVE
Ketones, ur: NEGATIVE mg/dL
Nitrite: NEGATIVE
PH: 6.5 (ref 5.0–8.0)
Protein, ur: NEGATIVE mg/dL
Specific Gravity, Urine: 1.025 (ref 1.005–1.030)
Urobilinogen, UA: 0.2 mg/dL (ref 0.0–1.0)

## 2015-10-18 LAB — OB RESULTS CONSOLE GBS: STREP GROUP B AG: POSITIVE

## 2015-10-18 LAB — OB RESULTS CONSOLE GC/CHLAMYDIA: Gonorrhea: NEGATIVE

## 2015-10-18 NOTE — Progress Notes (Signed)
Subjective:  Jamie Wilkinson is a 34 y.o. [phone] at [redacted]w[redacted]d being seen today for ongoing prenatal care.  Patient reports no complaints.  Contractions: Not present.  Vag. Bleeding: None. Movement: Present. Denies leaking of fluid.   The following portions of the patient's history were reviewed and updated as appropriate: allergies, current medications, past family history, past medical history, past social history, past surgical history and problem list. Problem list updated.  Objective:   Filed Vitals:   10/18/15 1141  BP: 116/75  Pulse: 85  Temp: 98.3 F (36.8 C)  Weight: 218 lb (98.884 kg)    Fetal Status: Fetal Heart Rate (bpm): 144   Movement: Present     General:  Alert, oriented and cooperative. Patient is in no acute distress.  Skin: Skin is warm and dry. No rash noted.   Cardiovascular: Normal heart rate noted  Respiratory: Normal respiratory effort, no problems with respiration noted  Abdomen: Soft, gravid, appropriate for gestational age. Pain/Pressure: Present     Pelvic: Vag. Bleeding: None     Cervical exam deferred        Extremities: Normal range of motion.  Edema: Trace  Mental Status: Normal mood and affect. Normal behavior. Normal judgment and thought content.   Urinalysis:      Assessment and Plan:  Pregnancy: [id number] at [redacted]w[redacted]d - scheduling section today, patient requesting 9/28 when will be 39+2 - g/c/gbs collected today, nkda  # size > dates - ultrasound  There are no diagnoses linked to this encounter. Preterm labor symptoms and general obstetric precautions including but not limited to vaginal bleeding, contractions, leaking of fluid and fetal movement were reviewed in detail with the patient. Please refer to After Visit Summary for other counseling recommendations.  No Follow-up on file.   Kathrynn Running, MD

## 2015-10-18 NOTE — Progress Notes (Signed)
Reviewed breast feeding education with patient

## 2015-10-19 LAB — GC/CHLAMYDIA PROBE AMP (~~LOC~~) NOT AT ARMC
Chlamydia: NEGATIVE
Neisseria Gonorrhea: NEGATIVE

## 2015-10-20 ENCOUNTER — Other Ambulatory Visit: Payer: Self-pay | Admitting: Family Medicine

## 2015-10-20 ENCOUNTER — Encounter (HOSPITAL_COMMUNITY): Payer: Self-pay | Admitting: *Deleted

## 2015-10-21 LAB — CULTURE, BETA STREP (GROUP B ONLY)

## 2015-10-25 ENCOUNTER — Ambulatory Visit (HOSPITAL_COMMUNITY)
Admission: RE | Admit: 2015-10-25 | Discharge: 2015-10-25 | Disposition: A | Discharge status: Home / Self Care | Payer: Medicaid Other | Source: Ambulatory Visit | Attending: Obstetrics and Gynecology | Admitting: Obstetrics and Gynecology

## 2015-10-25 ENCOUNTER — Other Ambulatory Visit: Payer: Self-pay | Admitting: General Practice

## 2015-10-25 VITALS — BP 130/83 | HR 84 | Wt 222.2 lb

## 2015-10-25 DIAGNOSIS — O34219 Maternal care for unspecified type scar from previous cesarean delivery: Secondary | ICD-10-CM

## 2015-10-25 DIAGNOSIS — Z3A36 36 weeks gestation of pregnancy: Secondary | ICD-10-CM | POA: Diagnosis not present

## 2015-10-25 DIAGNOSIS — O26843 Uterine size-date discrepancy, third trimester: Secondary | ICD-10-CM

## 2015-10-25 DIAGNOSIS — J45909 Unspecified asthma, uncomplicated: Secondary | ICD-10-CM

## 2015-10-25 DIAGNOSIS — O361920 Maternal care for other isoimmunization, second trimester, not applicable or unspecified: Secondary | ICD-10-CM

## 2015-10-25 DIAGNOSIS — F17210 Nicotine dependence, cigarettes, uncomplicated: Secondary | ICD-10-CM | POA: Diagnosis not present

## 2015-10-25 DIAGNOSIS — O99519 Diseases of the respiratory system complicating pregnancy, unspecified trimester: Secondary | ICD-10-CM

## 2015-10-25 DIAGNOSIS — O99333 Smoking (tobacco) complicating pregnancy, third trimester: Secondary | ICD-10-CM | POA: Insufficient documentation

## 2015-10-25 DIAGNOSIS — O09523 Supervision of elderly multigravida, third trimester: Secondary | ICD-10-CM

## 2015-10-25 DIAGNOSIS — O361110 Maternal care for Anti-A sensitization, first trimester, not applicable or unspecified: Secondary | ICD-10-CM

## 2015-10-26 ENCOUNTER — Ambulatory Visit (INDEPENDENT_AMBULATORY_CARE_PROVIDER_SITE_OTHER): Payer: Medicaid Other | Admitting: Student

## 2015-10-26 VITALS — BP 123/83 | HR 88 | Temp 98.2°F | Wt 221.6 lb

## 2015-10-26 DIAGNOSIS — O09523 Supervision of elderly multigravida, third trimester: Secondary | ICD-10-CM | POA: Diagnosis not present

## 2015-10-26 DIAGNOSIS — O09522 Supervision of elderly multigravida, second trimester: Secondary | ICD-10-CM

## 2015-10-26 LAB — POCT URINALYSIS DIP (DEVICE)
BILIRUBIN URINE: NEGATIVE
Glucose, UA: NEGATIVE mg/dL
KETONES UR: NEGATIVE mg/dL
NITRITE: NEGATIVE
Protein, ur: NEGATIVE mg/dL
SPECIFIC GRAVITY, URINE: 1.02 (ref 1.005–1.030)
Urobilinogen, UA: 0.2 mg/dL (ref 0.0–1.0)
pH: 7 (ref 5.0–8.0)

## 2015-10-26 NOTE — Progress Notes (Signed)
Subjective:  Jamie Wilkinson is a 34 y.o. [phone] at [redacted]w[redacted]d being seen today for ongoing prenatal care.  Patient reports no complaints.  Contractions: Not present.  Vag. Bleeding: None. Movement: Present. Denies leaking of fluid.   The following portions of the patient's history were reviewed and updated as appropriate: allergies, current medications, past family history, past medical history, past social history, past surgical history and problem list. Problem list updated.  Objective:   Filed Vitals:   10/26/15 0940  BP: 123/83  Pulse: 88  Temp: 98.2 F (36.8 C)  Weight: 221 lb 9.6 oz (100.517 kg)    Fetal Status: Fetal Heart Rate (bpm): 140 Fundal Height: 36 cm Movement: Present     General:  Alert, oriented and cooperative. Patient is in no acute distress.  Skin: Skin is warm and dry. No rash noted.   Cardiovascular: Normal heart rate noted  Respiratory: Normal respiratory effort, no problems with respiration noted  Abdomen: Soft, gravid, appropriate for gestational age. Pain/Pressure: Present     Pelvic: Vag. Bleeding: None     Cervical exam deferred        Extremities: Normal range of motion.  Edema: Trace  Mental Status: Normal mood and affect. Normal behavior. Normal judgment and thought content.   Urinalysis: Urine Protein: Negative Urine Glucose: Negative  Assessment and Plan:  Pregnancy: [id number] at [redacted]w[redacted]d  1. Supervision of high risk multigravida in second trimester in patient 35 years or older at time of delivery  - Culture, OB Urine - Repeat c/section scheduled   Term labor symptoms and general obstetric precautions including but not limited to vaginal bleeding, contractions, leaking of fluid and fetal movement were reviewed in detail with the patient. Please refer to After Visit Summary for other counseling recommendations.  Return in about 1 week (around 11/02/2015).   Judeth Horn, NP

## 2015-10-26 NOTE — Patient Instructions (Signed)
Cesarean Delivery Cesarean delivery is the birth of a baby through a cut (incision) in the abdomen and womb (uterus).  LET YOUR HEALTH CARE PROVIDER KNOW ABOUT:  All medicines you are taking, including vitamins, herbs, eye drops, creams, and over-the-counter medicines.  Previous problems you or members of your family have had with the use of anesthetics.  Any bleeding or blood clotting disorders you have.  Family history of blood clots or bleeding disorders.  Any history of deep vein thrombosis (DVT) or pulmonary embolism (PE).  Previous surgeries you have had.  Medical conditions you have.  Any allergies you have.  Complicationsinvolving the pregnancy. RISKS AND COMPLICATIONS  Generally, this is a safe procedure. However, as with any procedure, complications can occur. Possible complications include:  Bleeding.  Infection.  Blood clots.  Injury to surrounding organs.  Problems with anesthesia.  Injury to the baby. BEFORE THE PROCEDURE   You may be given an antacid medicine to drink. This will prevent acid contents in your stomach from going into your lungs if you vomit during the surgery.  You may be given an antibiotic medicine to prevent infection. PROCEDURE   To prevent infection of your incision:  Hair may be removed from your pubic area if it is near your incision.  The skin of your pubic area and lower abdomen will be cleaned with a germ-killing solution (antiseptic).  A tube (Foley catheter) will be placed in your bladder to drain your urine from your bladder into a bag. This keeps your bladder empty during surgery.  An IV tube will be placed in your vein.  You may be given medicine to numb the lower half of your body (regional anesthetic). If you were in labor, you may have already had an epidural in place which can be used in both labor and cesarean delivery. You may possibly be given medicine to make you sleep (general anesthetic) though this is not as  common.  Your heart rate and your baby's heart rate will be monitored.  An incision will be made in your abdomen that extends to your uterus. There are 2 basic kinds of incisions:  The horizontal (transverse) incision. Horizontal incisions are from side to side and are used for most routine cesarean deliveries.  The vertical incision. The vertical incision is from the top of the abdomen to the bottom and is less commonly used. It is often done for women who have a serious complication (extreme prematurity) or under emergency situations.  The horizontal and vertical incisions may both be used at the same time. However, this is very uncommon.  An incision is then made in your uterus to deliver the baby.  Your baby will be delivered.  Your health care provider may place the baby on your chest. It is important to keep the baby warm. Your health care provider will dry off the baby, place the baby directly on your bare skin, and cover the baby with warm, dry blankets.  Both incisions will be closed with absorbable stitches. AFTER THE PROCEDURE   If you were awake during the surgery, you will see your baby right away. If you were asleep, you will see your baby as soon as you are awake.  You may breastfeed your baby after surgery.  You may be able to get up and walk the same day as the surgery. If you need to stay in bed for a period of time, you will receive help to turn, cough, and take deep breaths after   surgery. This helps prevent lung problems such as pneumonia.  Do not get out of bed alone the first time after surgery. You will need help getting out of bed until you are able to do this by yourself.  You may be able to shower the day after your cesarean delivery. After the bandage (dressing) is taken off the incision site, a nurse will assist you to shower if you would like help.  You may be directed to take actions to help prevent blood clots in your legs. These may  include:  Walking shortly after surgery, with someone assisting you. Moving around after surgery helps to improve blood flow.  Wearing compression stockings or using different types of devices.  Taking medicines to thin your blood (anticoagulants) if you are at high risk for DVT or PE.  Save any blood clots that you pass from your vagina. If you pass a clot while on the toilet, do not flush it. Call for the nurse. Tell the nurse if you think you are bleeding too much or passing too many clots.  You will be given medicine for pain and nausea as needed. Let your health care providers know if you are hurting. You may also be given an antibiotic to prevent an infection.  Your IV tube will be taken out when you are drinking a reasonable amount of fluids. The Foley catheter is taken out when you are up and walking.  If your blood type is Rh negative and your baby's blood type is Rh positive, you will be given a shot of anti-D immune globulin. This shot prevents you from having Rh problems with a future pregnancy. You should get the shot even if you had your tubes tied (tubal ligation).  If you are allowed to take the baby for a walk, place the baby in the bassinet and push it.   This information is not intended to replace advice given to you by your health care provider. Make sure you discuss any questions you have with your health care provider.   Document Released: 12/03/2005 Document Revised: 08/24/2015 Document Reviewed: 07/30/2012 Elsevier Interactive Patient Education 2016 Elsevier Inc[employer] Fetal Movement Counts Patient Name: __________________________________________________ Patient Due Date: ____________________ Performing a fetal movement count is highly recommended in high-risk pregnancies, but it is good for every pregnant woman to do. Your health care provider may ask you to start counting fetal movements at 28 weeks of the pregnancy. Fetal movements often increase:  After eating a full  meal.  After physical activity.  After eating or drinking something sweet or cold.  At rest. Pay attention to when you feel the baby is most active. This will help you notice a pattern of your baby's sleep and wake cycles and what factors contribute to an increase in fetal movement. It is important to perform a fetal movement count at the same time each day when your baby is normally most active.  HOW TO COUNT FETAL MOVEMENTS  Find a quiet and comfortable area to sit or lie down on your left side. Lying on your left side provides the best blood and oxygen circulation to your baby.  Write down the day and time on a sheet of paper or in a journal.  Start counting kicks, flutters, swishes, rolls, or jabs in a 2-hour period. You should feel at least 10 movements within 2 hours.  If you do not feel 10 movements in 2 hours, wait 2-3 hours and count again. Look for a change in the  pattern or not enough counts in 2 hours. SEEK MEDICAL CARE IF:  You feel less than 10 counts in 2 hours, tried twice.  There is no movement in over an hour.  The pattern is changing or taking longer each day to reach 10 counts in 2 hours.  You feel the baby is not moving as he or she usually does. Date: ____________ Movements: ____________ Start time: ____________ Doreatha Martin time: ____________  Date: ____________ Movements: ____________ Start time: ____________ Doreatha Martin time: ____________ Date: ____________ Movements: ____________ Start time: ____________ Doreatha Martin time: ____________ Date: ____________ Movements: ____________ Start time: ____________ Doreatha Martin time: ____________ Date: ____________ Movements: ____________ Start time: ____________ Doreatha Martin time: ____________ Date: ____________ Movements: ____________ Start time: ____________ Doreatha Martin time: ____________ Date: ____________ Movements: ____________ Start time: ____________ Doreatha Martin time: ____________ Date: ____________ Movements: ____________ Start time: ____________  Doreatha Martin time: ____________  Date: ____________ Movements: ____________ Start time: ____________ Doreatha Martin time: ____________ Date: ____________ Movements: ____________ Start time: ____________ Doreatha Martin time: ____________ Date: ____________ Movements: ____________ Start time: ____________ Doreatha Martin time: ____________ Date: ____________ Movements: ____________ Start time: ____________ Doreatha Martin time: ____________ Date: ____________ Movements: ____________ Start time: ____________ Doreatha Martin time: ____________ Date: ____________ Movements: ____________ Start time: ____________ Doreatha Martin time: ____________ Date: ____________ Movements: ____________ Start time: ____________ Doreatha Martin time: ____________  Date: ____________ Movements: ____________ Start time: ____________ Doreatha Martin time: ____________ Date: ____________ Movements: ____________ Start time: ____________ Doreatha Martin time: ____________ Date: ____________ Movements: ____________ Start time: ____________ Doreatha Martin time: ____________ Date: ____________ Movements: ____________ Start time: ____________ Doreatha Martin time: ____________ Date: ____________ Movements: ____________ Start time: ____________ Doreatha Martin time: ____________ Date: ____________ Movements: ____________ Start time: ____________ Doreatha Martin time: ____________ Date: ____________ Movements: ____________ Start time: ____________ Doreatha Martin time: ____________  Date: ____________ Movements: ____________ Start time: ____________ Doreatha Martin time: ____________ Date: ____________ Movements: ____________ Start time: ____________ Doreatha Martin time: ____________ Date: ____________ Movements: ____________ Start time: ____________ Doreatha Martin time: ____________ Date: ____________ Movements: ____________ Start time: ____________ Doreatha Martin time: ____________ Date: ____________ Movements: ____________ Start time: ____________ Doreatha Martin time: ____________ Date: ____________ Movements: ____________ Start time: ____________ Doreatha Martin time: ____________ Date: ____________  Movements: ____________ Start time: ____________ Doreatha Martin time: ____________  Date: ____________ Movements: ____________ Start time: ____________ Doreatha Martin time: ____________ Date: ____________ Movements: ____________ Start time: ____________ Doreatha Martin time: ____________ Date: ____________ Movements: ____________ Start time: ____________ Doreatha Martin time: ____________ Date: ____________ Movements: ____________ Start time: ____________ Doreatha Martin time: ____________ Date: ____________ Movements: ____________ Start time: ____________ Doreatha Martin time: ____________ Date: ____________ Movements: ____________ Start time: ____________ Doreatha Martin time: ____________ Date: ____________ Movements: ____________ Start time: ____________ Doreatha Martin time: ____________  Date: ____________ Movements: ____________ Start time: ____________ Doreatha Martin time: ____________ Date: ____________ Movements: ____________ Start time: ____________ Doreatha Martin time: ____________ Date: ____________ Movements: ____________ Start time: ____________ Doreatha Martin time: ____________ Date: ____________ Movements: ____________ Start time: ____________ Doreatha Martin time: ____________ Date: ____________ Movements: ____________ Start time: ____________ Doreatha Martin time: ____________ Date: ____________ Movements: ____________ Start time: ____________ Doreatha Martin time: ____________ Date: ____________ Movements: ____________ Start time: ____________ Doreatha Martin time: ____________  Date: ____________ Movements: ____________ Start time: ____________ Doreatha Martin time: ____________ Date: ____________ Movements: ____________ Start time: ____________ Doreatha Martin time: ____________ Date: ____________ Movements: ____________ Start time: ____________ Doreatha Martin time: ____________ Date: ____________ Movements: ____________ Start time: ____________ Doreatha Martin time: ____________ Date: ____________ Movements: ____________ Start time: ____________ Doreatha Martin time: ____________ Date: ____________ Movements: ____________ Start time:  ____________ Doreatha Martin time: ____________ Date: ____________ Movements: ____________ Start time: ____________ Doreatha Martin time: ____________  Date: ____________ Movements: ____________ Start time: ____________ Doreatha Martin time: ____________ Date: ____________ Movements: ____________ Start time: ____________ Doreatha Martin time: ____________ Date: ____________ Movements: ____________ Start  time: ____________ Doreatha MartinFinish time: ____________ Date: ____________ Movements: ____________ Start time: ____________ Doreatha MartinFinish time: ____________ Date: ____________ Movements: ____________ Start time: ____________ Doreatha MartinFinish time: ____________ Date: ____________ Movements: ____________ Start time: ____________ Doreatha MartinFinish time: ____________   This information is not intended to replace advice given to you by your health care provider. Make sure you discuss any questions you have with your health care provider.   Document Released: 01/02/2007 Document Revised: 12/24/2014 Document Reviewed: 09/29/2012 Elsevier Interactive Patient Education 2016 Elsevier Inc. Ball CorporationBraxton Hicks Contractions Contractions of the uterus can occur throughout pregnancy. Contractions are not always a sign that you are in labor.  WHAT ARE BRAXTON HICKS CONTRACTIONS?  Contractions that occur before labor are called Braxton Hicks contractions, or false labor. Toward the end of pregnancy (32-34 weeks), these contractions can develop more often and may become more forceful. This is not true labor because these contractions do not result in opening (dilatation) and thinning of the cervix. They are sometimes difficult to tell apart from true labor because these contractions can be forceful and people have different pain tolerances. You should not feel embarrassed if you go to the hospital with false labor. Sometimes, the only way to tell if you are in true labor is for your health care provider to look for changes in the cervix. If there are no prenatal problems or other health problems  associated with the pregnancy, it is completely safe to be sent home with false labor and await the onset of true labor. HOW CAN YOU TELL THE DIFFERENCE BETWEEN TRUE AND FALSE LABOR? False Labor  The contractions of false labor are usually shorter and not as hard as those of true labor.   The contractions are usually irregular.   The contractions are often felt in the front of the lower abdomen and in the groin.   The contractions may go away when you walk around or change positions while lying down.   The contractions get weaker and are shorter lasting as time goes on.   The contractions do not usually become progressively stronger, regular, and closer together as with true labor.  True Labor  Contractions in true labor last 30-70 seconds, become very regular, usually become more intense, and increase in frequency.   The contractions do not go away with walking.   The discomfort is usually felt in the top of the uterus and spreads to the lower abdomen and low back.   True labor can be determined by your health care provider with an exam. This will show that the cervix is dilating and getting thinner.  WHAT TO REMEMBER  Keep up with your usual exercises and follow other instructions given by your health care provider.   Take medicines as directed by your health care provider.   Keep your regular prenatal appointments.   Eat and drink lightly if you think you are going into labor.   If Braxton Hicks contractions are making you uncomfortable:   Change your position from lying down or resting to walking, or from walking to resting.   Sit and rest in a tub of warm water.   Drink 2-3 glasses of water. Dehydration may cause these contractions.   Do slow and deep breathing several times an hour.  WHEN SHOULD I SEEK IMMEDIATE MEDICAL CARE? Seek immediate medical care if:  Your contractions become stronger, more regular, and closer together.   You have fluid  leaking or gushing from your vagina.   You have a fever.   You  pass blood-tinged mucus.   You have vaginal bleeding.   You have continuous abdominal pain.   You have low back pain that you never had before.   You feel your baby's head pushing down and causing pelvic pressure.   Your baby is not moving as much as it used to.    This information is not intended to replace advice given to you by your health care provider. Make sure you discuss any questions you have with your health care provider.   Document Released: 12/03/2005 Document Revised: 12/08/2013 Document Reviewed: 09/14/2013 Elsevier Interactive Patient Education [employer]

## 2015-10-26 NOTE — Progress Notes (Signed)
Urinalysis shows large leukocytes.

## 2015-10-27 LAB — CULTURE, OB URINE: Colony Count: 25000

## 2015-11-03 ENCOUNTER — Encounter: Payer: Self-pay | Admitting: Physician Assistant

## 2015-11-03 ENCOUNTER — Ambulatory Visit (INDEPENDENT_AMBULATORY_CARE_PROVIDER_SITE_OTHER): Payer: Medicaid Other | Admitting: Physician Assistant

## 2015-11-03 VITALS — BP 117/71 | HR 91 | Temp 98.4°F | Wt 223.2 lb

## 2015-11-03 DIAGNOSIS — J45909 Unspecified asthma, uncomplicated: Secondary | ICD-10-CM

## 2015-11-03 DIAGNOSIS — O9989 Other specified diseases and conditions complicating pregnancy, childbirth and the puerperium: Secondary | ICD-10-CM

## 2015-11-03 DIAGNOSIS — O99519 Diseases of the respiratory system complicating pregnancy, unspecified trimester: Principal | ICD-10-CM

## 2015-11-03 LAB — POCT URINALYSIS DIP (DEVICE)
BILIRUBIN URINE: NEGATIVE
GLUCOSE, UA: NEGATIVE mg/dL
Hgb urine dipstick: NEGATIVE
KETONES UR: NEGATIVE mg/dL
Leukocytes, UA: NEGATIVE
NITRITE: NEGATIVE
PH: 7 (ref 5.0–8.0)
PROTEIN: NEGATIVE mg/dL
Specific Gravity, Urine: 1.02 (ref 1.005–1.030)
Urobilinogen, UA: 0.2 mg/dL (ref 0.0–1.0)

## 2015-11-03 NOTE — Progress Notes (Signed)
Subjective:  Jamie Wilkinson is a 34 y.o. [phone] at [redacted]w[redacted]d being seen today for ongoing prenatal care.  Patient reports no complaints.  Contractions: Not present.  Vag. Bleeding: None. Movement: Present. Denies leaking of fluid.   The following portions of the patient's history were reviewed and updated as appropriate: allergies, current medications, past family history, past medical history, past social history, past surgical history and problem list.   Objective:   Filed Vitals:   11/03/15 1310  BP: 117/71  Pulse: 91  Temp: 98.4 F (36.9 C)  Weight: 223 lb 3.2 oz (101.243 kg)    Fetal Status:     Movement: Present     General:  Alert, oriented and cooperative. Patient is in no acute distress.  Skin: Skin is warm and dry. No rash noted.   Cardiovascular: Normal heart rate noted  Respiratory: Normal respiratory effort, no problems with respiration noted  Abdomen: Soft, gravid, appropriate for gestational age. Pain/Pressure: Present     Pelvic: Vag. Bleeding: None     Cervical exam deferred        Extremities: Normal range of motion.  Edema: Trace  Mental Status: Normal mood and affect. Normal behavior. Normal judgment and thought content.   Urinalysis: Urine Protein: Negative Urine Glucose: Negative  Assessment and Plan:  Pregnancy: [id number] at [redacted]w[redacted]d  1.  Supervision high risk pregnancy at 37 weeks: Kell positive  But FOB negative, Absent nasal bone, NIPS negative   2.  H/o Csection x 3 - Cesarean scheduled for 11/28.  Pre-op scheduled also.   Work note provided.   Term labor symptoms and general obstetric precautions including but not limited to vaginal bleeding, contractions, leaking of fluid and fetal movement were reviewed in detail with the patient. Please refer to After Visit Summary for other counseling recommendations.  Return in about 1 week (around 11/10/2015) for [facility].   Bertram Denver, PA-C

## 2015-11-03 NOTE — Patient Instructions (Signed)

## 2015-11-03 NOTE — Progress Notes (Signed)
Subjective:  Jamie Wilkinson is a 34 y.o. [phone] at [redacted]w[redacted]d being seen today for ongoing prenatal care.  Patient reports no complaints.  Contractions: Not present.  Vag. Bleeding: None. Movement: Present. Denies leaking of fluid. Has mild SOB w/ exertion but it is manageable. Denies, Chest pain, dysuria, urinary frequency, vaginal pain/itching/irritation, N/V/C/D, rashes, or joint pains.   The following portions of the patient's history were reviewed and updated as appropriate: allergies, current medications, past family history, past medical history, past social history, past surgical history and problem list. Problem list updated.  Objective:   Filed Vitals:   11/03/15 1310  BP: 117/71  Pulse: 91  Temp: 98.4 F (36.9 C)  Weight: 223 lb 3.2 oz (101.243 kg)    Fetal Status:     Movement: Present     General:  Alert, oriented and cooperative. Patient is in no acute distress.  Skin: Skin is warm and dry. No rash noted.   Cardiovascular: Normal heart rate noted  Respiratory: Normal respiratory effort, no problems with respiration noted  Abdomen: Soft, gravid, appropriate for gestational age. Pain/Pressure: Present     Pelvic: Vag. Bleeding: None     Cervical exam deferred        Extremities: Normal range of motion.  Edema: Trace  Mental Status: Normal mood and affect. Normal behavior. Normal judgment and thought content.   Urinalysis: Urine Protein: Negative Urine Glucose: Negative  Assessment and Plan:  Pregnancy: [id number] at [redacted]w[redacted]d  1. High Risk Preg, Asthma, Kell ab positive, absent nasal bone on U/S - No conerns patient is doing well - Scheduled C/S for 28th of November  - Patient provided note for work stating C/S on 28th last day of work 26th and 8 weeks post C/S returning - Follow-up appts below  2. Possible fetal abnormality and Kell Positive mother. - MFM consulted, father proved to be Kell negative, tested twice in previous pregnancies, no concern -  Absent nasal bone  noted on [place]. Quad screen abnl--> NIPS: WNL.   Future Appointments Date Time Provider Department Center  11/09/2015 8:00 AM Rhea Pink, CNM [facility]  11/11/2015 2:15 PM [facility] PAT 2 [facility] None   Preterm labor symptoms and general obstetric precautions including but not limited to vaginal bleeding, contractions, leaking of fluid and fetal movement were reviewed in detail with the patient. Please refer to After Visit Summary for other counseling recommendations.  Return in about 1 week (around 11/10/2015) for [facility]   Janett Labella, Med Student

## 2015-11-03 NOTE — Progress Notes (Signed)
Reviewed tip of week with patient  

## 2015-11-09 ENCOUNTER — Encounter: Payer: Medicaid Other | Admitting: Advanced Practice Midwife

## 2015-11-09 NOTE — Patient Instructions (Signed)
Your procedure is scheduled on:  Monday, Nov. 28, 2016  Enter through the [employer] of [facility] at:  8:00 AM  Pick up the phone at the desk and dial [phone].  Call this number if you have problems the morning of surgery: [phone].  Remember: Do NOT eat food or drink after:  Midnight Sunday Take these medicines the morning of surgery with a SIP OF WATER: None  *Bring asthma inhalers day of surgery  Do NOT wear jewelry (body piercing), metal hair clips/bobby pins,  or nail polish. Do NOT wear lotions, powders, or perfumes.  You may wear deoderant. Do NOT shave for 48 hours prior to surgery. Do NOT bring valuables to the hospital. Leave suitcase in car.  After surgery it may be brought to your room.  For patients admitted to the hospital, checkout time is 11:00 AM the day of discharge.

## 2015-11-11 ENCOUNTER — Encounter (HOSPITAL_COMMUNITY): Payer: Self-pay

## 2015-11-11 ENCOUNTER — Encounter (HOSPITAL_COMMUNITY)
Admission: RE | Admit: 2015-11-11 | Discharge: 2015-11-11 | Disposition: A | Discharge status: Home / Self Care | Payer: Medicaid Other | Source: Ambulatory Visit | Attending: Family Medicine | Admitting: Family Medicine

## 2015-11-11 DIAGNOSIS — Z01818 Encounter for other preprocedural examination: Secondary | ICD-10-CM | POA: Insufficient documentation

## 2015-11-11 LAB — CBC
HEMATOCRIT: 34.5 % — AB (ref 36.0–46.0)
HEMOGLOBIN: 11.7 g/dL — AB (ref 12.0–15.0)
MCH: 30.6 pg (ref 26.0–34.0)
MCHC: 33.9 g/dL (ref 30.0–36.0)
MCV: 90.3 fL (ref 78.0–100.0)
Platelets: 163 10*3/uL (ref 150–400)
RBC: 3.82 MIL/uL — AB (ref 3.87–5.11)
RDW: 13.4 % (ref 11.5–15.5)
WBC: 12.3 10*3/uL — ABNORMAL HIGH (ref 4.0–10.5)

## 2015-11-12 LAB — RPR: RPR: NONREACTIVE

## 2015-11-14 ENCOUNTER — Inpatient Hospital Stay (HOSPITAL_COMMUNITY): Payer: Medicaid Other | Admitting: Anesthesiology

## 2015-11-14 ENCOUNTER — Inpatient Hospital Stay (HOSPITAL_COMMUNITY)
Admission: RE | Admit: 2015-11-14 | Discharge: 2015-11-17 | DRG: 766 | Disposition: A | Discharge status: Home / Self Care | Payer: Medicaid Other | Source: Ambulatory Visit | Attending: Family Medicine | Admitting: Family Medicine

## 2015-11-14 ENCOUNTER — Encounter (HOSPITAL_COMMUNITY)
Admission: RE | Disposition: A | Discharge status: Home / Self Care | Payer: Self-pay | Source: Ambulatory Visit | Attending: Family Medicine

## 2015-11-14 ENCOUNTER — Encounter (HOSPITAL_COMMUNITY): Payer: Self-pay | Admitting: Anesthesiology

## 2015-11-14 DIAGNOSIS — O359XX0 Maternal care for (suspected) fetal abnormality and damage, unspecified, not applicable or unspecified: Secondary | ICD-10-CM | POA: Diagnosis present

## 2015-11-14 DIAGNOSIS — O09522 Supervision of elderly multigravida, second trimester: Secondary | ICD-10-CM

## 2015-11-14 DIAGNOSIS — Z8249 Family history of ischemic heart disease and other diseases of the circulatory system: Secondary | ICD-10-CM | POA: Diagnosis not present

## 2015-11-14 DIAGNOSIS — Z809 Family history of malignant neoplasm, unspecified: Secondary | ICD-10-CM

## 2015-11-14 DIAGNOSIS — Z87891 Personal history of nicotine dependence: Secondary | ICD-10-CM

## 2015-11-14 DIAGNOSIS — Z6837 Body mass index (BMI) 37.0-37.9, adult: Secondary | ICD-10-CM | POA: Diagnosis not present

## 2015-11-14 DIAGNOSIS — O99519 Diseases of the respiratory system complicating pregnancy, unspecified trimester: Secondary | ICD-10-CM

## 2015-11-14 DIAGNOSIS — O09529 Supervision of elderly multigravida, unspecified trimester: Secondary | ICD-10-CM | POA: Diagnosis not present

## 2015-11-14 DIAGNOSIS — Z98891 History of uterine scar from previous surgery: Secondary | ICD-10-CM

## 2015-11-14 DIAGNOSIS — Z833 Family history of diabetes mellitus: Secondary | ICD-10-CM

## 2015-11-14 DIAGNOSIS — E669 Obesity, unspecified: Secondary | ICD-10-CM | POA: Diagnosis present

## 2015-11-14 DIAGNOSIS — O99214 Obesity complicating childbirth: Secondary | ICD-10-CM | POA: Diagnosis present

## 2015-11-14 DIAGNOSIS — O361920 Maternal care for other isoimmunization, second trimester, not applicable or unspecified: Secondary | ICD-10-CM | POA: Diagnosis present

## 2015-11-14 DIAGNOSIS — J45909 Unspecified asthma, uncomplicated: Secondary | ICD-10-CM

## 2015-11-14 DIAGNOSIS — Z3A39 39 weeks gestation of pregnancy: Secondary | ICD-10-CM

## 2015-11-14 DIAGNOSIS — O34211 Maternal care for low transverse scar from previous cesarean delivery: Principal | ICD-10-CM | POA: Diagnosis present

## 2015-11-14 DIAGNOSIS — O34219 Maternal care for unspecified type scar from previous cesarean delivery: Secondary | ICD-10-CM

## 2015-11-14 DIAGNOSIS — O99824 Streptococcus B carrier state complicating childbirth: Secondary | ICD-10-CM | POA: Diagnosis present

## 2015-11-14 LAB — PREPARE RBC (CROSSMATCH)

## 2015-11-14 SURGERY — Surgical Case
Anesthesia: Spinal | Site: Abdomen

## 2015-11-14 MED ORDER — ONDANSETRON HCL 4 MG/2ML IJ SOLN
INTRAMUSCULAR | Status: DC | PRN
  Administered 2015-11-14: 4 mg via INTRAVENOUS

## 2015-11-14 MED ORDER — SIMETHICONE 80 MG PO CHEW
80.0000 mg | CHEWABLE_TABLET | ORAL | Status: DC
  Administered 2015-11-14 – 2015-11-17 (×3): 80 mg via ORAL
  Filled 2015-11-14 (×3): qty 1

## 2015-11-14 MED ORDER — CLEAR EYES COMPLETE OP SOLN: 1.0000 [drp] | Freq: Every day | OPHTHALMIC | Status: DC | PRN

## 2015-11-14 MED ORDER — DIBUCAINE 1 % RE OINT: 1.0000 "application " | TOPICAL_OINTMENT | RECTAL | Status: DC | PRN

## 2015-11-14 MED ORDER — OXYTOCIN 10 UNIT/ML IJ SOLN
40.0000 [IU] | INTRAVENOUS | Status: DC | PRN
  Administered 2015-11-14: 40 [IU] via INTRAVENOUS

## 2015-11-14 MED ORDER — PHENYLEPHRINE 8 MG IN D5W 100 ML (0.08MG/ML) PREMIX OPTIME
INJECTION | INTRAVENOUS | Status: AC
Start: 2015-11-14 — End: 2015-11-14
  Filled 2015-11-14: qty 100

## 2015-11-14 MED ORDER — CEFAZOLIN SODIUM-DEXTROSE 2-3 GM-% IV SOLR
INTRAVENOUS | Status: AC
  Filled 2015-11-14: qty 50

## 2015-11-14 MED ORDER — WITCH HAZEL-GLYCERIN EX PADS: 1.0000 "application " | MEDICATED_PAD | CUTANEOUS | Status: DC | PRN

## 2015-11-14 MED ORDER — NALBUPHINE HCL 10 MG/ML IJ SOLN: 5.0000 mg | Freq: Once | INTRAMUSCULAR | Status: DC | PRN

## 2015-11-14 MED ORDER — KETOROLAC TROMETHAMINE 30 MG/ML IJ SOLN: 30.0000 mg | Freq: Once | INTRAMUSCULAR | Status: DC | PRN

## 2015-11-14 MED ORDER — SODIUM CHLORIDE 0.9 % IJ SOLN: 3.0000 mL | INTRAMUSCULAR | Status: DC | PRN

## 2015-11-14 MED ORDER — ACETAMINOPHEN 500 MG PO TABS
1000.0000 mg | ORAL_TABLET | Freq: Four times a day (QID) | ORAL | Status: AC
  Administered 2015-11-14: 1000 mg via ORAL
  Filled 2015-11-14: qty 2

## 2015-11-14 MED ORDER — NALBUPHINE HCL 10 MG/ML IJ SOLN: 5.0000 mg | INTRAMUSCULAR | Status: DC | PRN

## 2015-11-14 MED ORDER — SENNOSIDES-DOCUSATE SODIUM 8.6-50 MG PO TABS
2.0000 | ORAL_TABLET | ORAL | Status: DC
  Administered 2015-11-14 – 2015-11-17 (×3): 2 via ORAL
  Filled 2015-11-14 (×5): qty 2

## 2015-11-14 MED ORDER — KETOROLAC TROMETHAMINE 30 MG/ML IJ SOLN
30.0000 mg | Freq: Four times a day (QID) | INTRAMUSCULAR | Status: AC | PRN
  Administered 2015-11-14: 30 mg via INTRAVENOUS
  Filled 2015-11-14: qty 1

## 2015-11-14 MED ORDER — SIMETHICONE 80 MG PO CHEW
80.0000 mg | CHEWABLE_TABLET | ORAL | Status: DC | PRN
  Administered 2015-11-16: 80 mg via ORAL

## 2015-11-14 MED ORDER — OXYCODONE-ACETAMINOPHEN 5-325 MG PO TABS
2.0000 | ORAL_TABLET | ORAL | Status: DC | PRN
  Administered 2015-11-14 – 2015-11-17 (×14): 2 via ORAL
  Filled 2015-11-14 (×14): qty 2

## 2015-11-14 MED ORDER — NALOXONE HCL 0.4 MG/ML IJ SOLN: 0.4000 mg | INTRAMUSCULAR | Status: DC | PRN

## 2015-11-14 MED ORDER — DIPHENHYDRAMINE HCL 25 MG PO CAPS
25.0000 mg | ORAL_CAPSULE | ORAL | Status: DC | PRN
  Filled 2015-11-14: qty 1

## 2015-11-14 MED ORDER — BUPIVACAINE IN DEXTROSE 0.75-8.25 % IT SOLN
INTRATHECAL | Status: DC | PRN
  Administered 2015-11-14: 1.6 mL via INTRATHECAL

## 2015-11-14 MED ORDER — DIPHENHYDRAMINE HCL 50 MG/ML IJ SOLN: 12.5000 mg | INTRAMUSCULAR | Status: DC | PRN

## 2015-11-14 MED ORDER — LACTATED RINGERS IV SOLN
INTRAVENOUS | Status: DC
  Administered 2015-11-14: 15:00:00 via INTRAVENOUS

## 2015-11-14 MED ORDER — TETANUS-DIPHTH-ACELL PERTUSSIS 5-2.5-18.5 LF-MCG/0.5 IM SUSP: 0.5000 mL | Freq: Once | INTRAMUSCULAR | Status: DC

## 2015-11-14 MED ORDER — CEFAZOLIN SODIUM-DEXTROSE 2-3 GM-% IV SOLR
2.0000 g | INTRAVENOUS | Status: AC
  Administered 2015-11-14: 2 g via INTRAVENOUS

## 2015-11-14 MED ORDER — OXYTOCIN 40 UNITS IN LACTATED RINGERS INFUSION - SIMPLE MED: 62.5000 mL/h | INTRAVENOUS | Status: AC

## 2015-11-14 MED ORDER — MORPHINE SULFATE (PF) 0.5 MG/ML IJ SOLN
INTRAMUSCULAR | Status: DC | PRN
  Administered 2015-11-14: .2 mg via INTRATHECAL

## 2015-11-14 MED ORDER — POLYVINYL ALCOHOL 1.4 % OP SOLN: 1.0000 [drp] | Freq: Every day | OPHTHALMIC | Status: DC | PRN

## 2015-11-14 MED ORDER — NALOXONE HCL 2 MG/2ML IJ SOSY
1.0000 ug/kg/h | PREFILLED_SYRINGE | INTRAVENOUS | Status: DC | PRN
  Filled 2015-11-14: qty 2

## 2015-11-14 MED ORDER — FENTANYL CITRATE (PF) 100 MCG/2ML IJ SOLN
INTRAMUSCULAR | Status: DC | PRN
  Administered 2015-11-14: 20 ug via INTRATHECAL

## 2015-11-14 MED ORDER — PRENATAL MULTIVITAMIN CH
1.0000 | ORAL_TABLET | Freq: Every day | ORAL | Status: DC
  Administered 2015-11-15 – 2015-11-17 (×3): 1 via ORAL
  Filled 2015-11-14 (×3): qty 1

## 2015-11-14 MED ORDER — MORPHINE SULFATE (PF) 0.5 MG/ML IJ SOLN
INTRAMUSCULAR | Status: AC
  Filled 2015-11-14: qty 10

## 2015-11-14 MED ORDER — OXYTOCIN 10 UNIT/ML IJ SOLN
INTRAMUSCULAR | Status: AC
  Filled 2015-11-14: qty 4

## 2015-11-14 MED ORDER — SCOPOLAMINE 1 MG/3DAYS TD PT72
1.0000 | MEDICATED_PATCH | Freq: Once | TRANSDERMAL | Status: DC
  Administered 2015-11-14: 1.5 mg via TRANSDERMAL

## 2015-11-14 MED ORDER — KETOROLAC TROMETHAMINE 30 MG/ML IJ SOLN
INTRAMUSCULAR | Status: AC
  Administered 2015-11-14: 30 mg via INTRAMUSCULAR
  Filled 2015-11-14: qty 1

## 2015-11-14 MED ORDER — MENTHOL 3 MG MT LOZG: 1.0000 | LOZENGE | OROMUCOSAL | Status: DC | PRN

## 2015-11-14 MED ORDER — PHENYLEPHRINE 8 MG IN D5W 100 ML (0.08MG/ML) PREMIX OPTIME
INJECTION | INTRAVENOUS | Status: DC | PRN
  Administered 2015-11-14: 60 ug/min via INTRAVENOUS

## 2015-11-14 MED ORDER — LACTATED RINGERS IV SOLN
INTRAVENOUS | Status: DC
  Administered 2015-11-14: 10:00:00 via INTRAVENOUS

## 2015-11-14 MED ORDER — OXYCODONE-ACETAMINOPHEN 5-325 MG PO TABS
1.0000 | ORAL_TABLET | ORAL | Status: DC | PRN
  Filled 2015-11-14: qty 1

## 2015-11-14 MED ORDER — LACTATED RINGERS IV SOLN
125.0000 mL/h | INTRAVENOUS | Status: DC
  Administered 2015-11-14: 10:00:00 via INTRAVENOUS
  Administered 2015-11-14: 125 mL/h via INTRAVENOUS
  Administered 2015-11-14 (×2): via INTRAVENOUS

## 2015-11-14 MED ORDER — IPRATROPIUM-ALBUTEROL 0.5-2.5 (3) MG/3ML IN SOLN
3.0000 mL | Freq: Four times a day (QID) | RESPIRATORY_TRACT | Status: DC | PRN
  Filled 2015-11-14: qty 3

## 2015-11-14 MED ORDER — MEPERIDINE HCL 25 MG/ML IJ SOLN: 6.2500 mg | INTRAMUSCULAR | Status: DC | PRN

## 2015-11-14 MED ORDER — METOCLOPRAMIDE HCL 5 MG/ML IJ SOLN
INTRAMUSCULAR | Status: AC
  Filled 2015-11-14: qty 2

## 2015-11-14 MED ORDER — SCOPOLAMINE 1 MG/3DAYS TD PT72
MEDICATED_PATCH | TRANSDERMAL | Status: AC
  Administered 2015-11-14: 1.5 mg via TRANSDERMAL
  Filled 2015-11-14: qty 1

## 2015-11-14 MED ORDER — PROMETHAZINE HCL 25 MG/ML IJ SOLN: 6.2500 mg | INTRAMUSCULAR | Status: DC | PRN

## 2015-11-14 MED ORDER — ZOLPIDEM TARTRATE 5 MG PO TABS: 5.0000 mg | ORAL_TABLET | Freq: Every evening | ORAL | Status: DC | PRN

## 2015-11-14 MED ORDER — DIPHENHYDRAMINE HCL 25 MG PO CAPS
25.0000 mg | ORAL_CAPSULE | Freq: Four times a day (QID) | ORAL | Status: DC | PRN
  Filled 2015-11-14: qty 1

## 2015-11-14 MED ORDER — IBUPROFEN 600 MG PO TABS: 600.0000 mg | ORAL_TABLET | Freq: Four times a day (QID) | ORAL | Status: DC | PRN

## 2015-11-14 MED ORDER — IBUPROFEN 600 MG PO TABS
600.0000 mg | ORAL_TABLET | Freq: Four times a day (QID) | ORAL | Status: DC
  Administered 2015-11-14 – 2015-11-17 (×11): 600 mg via ORAL
  Filled 2015-11-14 (×11): qty 1

## 2015-11-14 MED ORDER — FENTANYL CITRATE (PF) 100 MCG/2ML IJ SOLN
INTRAMUSCULAR | Status: AC
  Filled 2015-11-14: qty 2

## 2015-11-14 MED ORDER — HYDROMORPHONE HCL 1 MG/ML IJ SOLN: 0.2500 mg | INTRAMUSCULAR | Status: DC | PRN

## 2015-11-14 MED ORDER — SIMETHICONE 80 MG PO CHEW
80.0000 mg | CHEWABLE_TABLET | Freq: Three times a day (TID) | ORAL | Status: DC
  Administered 2015-11-15 – 2015-11-17 (×5): 80 mg via ORAL
  Filled 2015-11-14 (×6): qty 1

## 2015-11-14 MED ORDER — ONDANSETRON HCL 4 MG/2ML IJ SOLN
4.0000 mg | Freq: Three times a day (TID) | INTRAMUSCULAR | Status: DC | PRN
  Administered 2015-11-14: 4 mg via INTRAVENOUS
  Filled 2015-11-14: qty 2

## 2015-11-14 MED ORDER — ALBUTEROL SULFATE (2.5 MG/3ML) 0.083% IN NEBU
3.0000 mL | INHALATION_SOLUTION | Freq: Four times a day (QID) | RESPIRATORY_TRACT | Status: DC | PRN
  Administered 2015-11-14: 3 mL via RESPIRATORY_TRACT
  Filled 2015-11-14: qty 3

## 2015-11-14 MED ORDER — SODIUM CHLORIDE 0.9 % IR SOLN
Status: DC | PRN
  Administered 2015-11-14: 1000 mL

## 2015-11-14 MED ORDER — METOCLOPRAMIDE HCL 5 MG/ML IJ SOLN
INTRAMUSCULAR | Status: DC | PRN
  Administered 2015-11-14: 10 mg via INTRAVENOUS

## 2015-11-14 MED ORDER — ACETAMINOPHEN 325 MG PO TABS: 650.0000 mg | ORAL_TABLET | ORAL | Status: DC | PRN

## 2015-11-14 MED ORDER — LANOLIN HYDROUS EX OINT: 1.0000 "application " | TOPICAL_OINTMENT | CUTANEOUS | Status: DC | PRN

## 2015-11-14 MED ORDER — ONDANSETRON HCL 4 MG/2ML IJ SOLN
INTRAMUSCULAR | Status: AC
Start: 2015-11-14 — End: 2015-11-14
  Filled 2015-11-14: qty 2

## 2015-11-14 MED ORDER — KETOROLAC TROMETHAMINE 30 MG/ML IJ SOLN
30.0000 mg | Freq: Four times a day (QID) | INTRAMUSCULAR | Status: AC | PRN
  Administered 2015-11-14: 30 mg via INTRAMUSCULAR

## 2015-11-14 SURGICAL SUPPLY — 43 items
APL SKNCLS STERI-STRIP NONHPOA (GAUZE/BANDAGES/DRESSINGS) ×1
BENZOIN TINCTURE PRP APPL 2/3 (GAUZE/BANDAGES/DRESSINGS) ×3 IMPLANT
CATH ROBINSON RED A/P 16FR (CATHETERS) IMPLANT
CLAMP CORD UMBIL (MISCELLANEOUS) IMPLANT
CLOSURE STERI STRIP 1/2 X4 (GAUZE/BANDAGES/DRESSINGS) ×4 IMPLANT
CLOSURE WOUND 1/2 X4 (GAUZE/BANDAGES/DRESSINGS) ×1
CLOTH BEACON ORANGE TIMEOUT ST (SAFETY) ×3 IMPLANT
DRAPE SHEET LG 3/4 BI-LAMINATE (DRAPES) IMPLANT
DRSG OPSITE POSTOP 4X10 (GAUZE/BANDAGES/DRESSINGS) ×3 IMPLANT
DRSG OPSITE POSTOP 4X12 (GAUZE/BANDAGES/DRESSINGS) ×2 IMPLANT
DURAPREP 26ML APPLICATOR (WOUND CARE) ×3 IMPLANT
ELECT REM PT RETURN 9FT ADLT (ELECTROSURGICAL) ×3
ELECTRODE REM PT RTRN 9FT ADLT (ELECTROSURGICAL) ×1 IMPLANT
EXTRACTOR VACUUM M CUP 4 TUBE (SUCTIONS) IMPLANT
EXTRACTOR VACUUM M CUP 4' TUBE (SUCTIONS)
GLOVE BIOGEL PI IND STRL 7.0 (GLOVE) ×1 IMPLANT
GLOVE BIOGEL PI IND STRL 7.5 (GLOVE) ×2 IMPLANT
GLOVE BIOGEL PI INDICATOR 7.0 (GLOVE) ×2
GLOVE BIOGEL PI INDICATOR 7.5 (GLOVE) ×4
GLOVE ECLIPSE 7.5 STRL STRAW (GLOVE) ×3 IMPLANT
GOWN STRL REUS W/TWL LRG LVL3 (GOWN DISPOSABLE) ×9 IMPLANT
KIT ABG SYR 3ML LUER SLIP (SYRINGE) IMPLANT
NDL HYPO 25X5/8 SAFETYGLIDE (NEEDLE) IMPLANT
NEEDLE HYPO 25X5/8 SAFETYGLIDE (NEEDLE) IMPLANT
NS IRRIG 1000ML POUR BTL (IV SOLUTION) ×3 IMPLANT
PACK C SECTION WH (CUSTOM PROCEDURE TRAY) ×3 IMPLANT
PAD OB MATERNITY 4.3X12.25 (PERSONAL CARE ITEMS) ×3 IMPLANT
PENCIL SMOKE EVAC W/HOLSTER (ELECTROSURGICAL) ×3 IMPLANT
RTRCTR C-SECT PINK 25CM LRG (MISCELLANEOUS) ×3 IMPLANT
SPONGE LAP 18X18 X RAY DECT (DISPOSABLE) ×2 IMPLANT
STRIP CLOSURE SKIN 1/2X4 (GAUZE/BANDAGES/DRESSINGS) ×2 IMPLANT
SUT MNCRL 0 VIOLET CTX 36 (SUTURE) IMPLANT
SUT MONOCRYL 0 CTX 36 (SUTURE)
SUT PDS AB 0 CTX 60 (SUTURE) ×2 IMPLANT
SUT PLAIN 2 0 XLH (SUTURE) ×2 IMPLANT
SUT VIC AB 0 CTX 36 (SUTURE) ×12
SUT VIC AB 0 CTX36XBRD ANBCTRL (SUTURE) ×3 IMPLANT
SUT VIC AB 2-0 CT1 (SUTURE) ×2 IMPLANT
SUT VIC AB 2-0 CT1 27 (SUTURE) ×3
SUT VIC AB 2-0 CT1 TAPERPNT 27 (SUTURE) ×1 IMPLANT
SUT VIC AB 4-0 KS 27 (SUTURE) ×3 IMPLANT
TOWEL OR 17X24 6PK STRL BLUE (TOWEL DISPOSABLE) ×3 IMPLANT
TRAY FOLEY CATH SILVER 14FR (SET/KITS/TRAYS/PACK) ×3 IMPLANT

## 2015-11-14 NOTE — Addendum Note (Signed)
Addendum  created 11/14/15 1659 by Shanon Payor, CRNA   Modules edited: Clinical Notes   Clinical Notes:  File: [id number]

## 2015-11-14 NOTE — Anesthesia Procedure Notes (Signed)
Spinal Patient location during procedure: OR Start time: 11/14/2015 9:22 AM End time: 11/14/2015 9:26 AM Staffing Anesthesiologist: Leilani Able Performed by: anesthesiologist  Preanesthetic Checklist Completed: patient identified, surgical consent, pre-op evaluation, timeout performed, IV checked, risks and benefits discussed and monitors and equipment checked Spinal Block Patient position: sitting Prep: site prepped and draped and DuraPrep Patient monitoring: heart rate, cardiac monitor, continuous pulse ox and blood pressure Approach: midline Location: L3-4 Injection technique: single-shot Needle Needle type: Pencan  Needle gauge: 24 G Needle length: 9 cm Needle insertion depth: 7 cm Assessment Sensory level: T8

## 2015-11-14 NOTE — Transfer of Care (Signed)
Immediate Anesthesia Transfer of Care Note  Patient: Jamie Wilkinson  Procedure(s) Performed: Procedure(s): CESAREAN SECTION (N/A)  Patient Location: PACU  Anesthesia Type:Spinal  Level of Consciousness: awake, alert , oriented and patient cooperative  Airway & Oxygen Therapy: Patient Spontanous Breathing  Post-op Assessment: Report given to RN and Post -op Vital signs reviewed and stable  Post vital signs: Reviewed and stable  Last Vitals:  Filed Vitals:   11/14/15 0832  BP: 133/90  Pulse: 88  Temp: 36.4 C  Resp: 20    Complications: No apparent anesthesia complications

## 2015-11-14 NOTE — Lactation Note (Signed)
This note was copied from the chart of Jamie Wilkinson. Lactation Consultation Note  Patient Name: Jamie Wilkinson [id number] Date: 11/14/2015 Reason for consult: Initial assessment    With this mom of a term baby, now 7 hours old. Mom has formula fed 3 times. She may want to do some breastfeeding, and will call for help with latching if she decides to try. This is mom's fourth baby.    Maternal Data Formula Feeding for Exclusion: Yes Reason for exclusion: Mother's choice to formula feed on admision Has patient been taught Hand Expression?: No  Feeding Feeding Type: Formula Nipple Type: Slow - flow  LATCH Score/Interventions                      Lactation Tools Discussed/Used     Consult Status Consult Status: PRN Follow-up type: Call as needed    Alfred Levins 11/14/2015, 5:51 PM

## 2015-11-14 NOTE — Anesthesia Preprocedure Evaluation (Addendum)
Anesthesia Evaluation  Patient identified by MRN, date of birth, ID band Patient awake    Reviewed: Allergy & Precautions, H&P , NPO status , Patient's Chart, lab work & pertinent test results  Airway Mallampati: II  TM Distance: >3 FB Neck ROM: full    Dental no notable dental hx.    Pulmonary former smoker,    Pulmonary exam normal        Cardiovascular negative cardio ROS Normal cardiovascular exam     Neuro/Psych negative neurological ROS  negative psych ROS   GI/Hepatic Neg liver ROS,   Endo/Other  negative endocrine ROS  Renal/GU      Musculoskeletal   Abdominal (+) + obese,   Peds  Hematology negative hematology ROS (+)   Anesthesia Other Findings   Reproductive/Obstetrics (+) Pregnancy                           Anesthesia Physical Anesthesia Plan  ASA: II  Anesthesia Plan: Spinal   Post-op Pain Management:    Induction:   Airway Management Planned:   Additional Equipment:   Intra-op Plan:   Post-operative Plan:   Informed Consent: I have reviewed the patients History and Physical, chart, labs and discussed the procedure including the risks, benefits and alternatives for the proposed anesthesia with the patient or authorized representative who has indicated his/her understanding and acceptance.     Plan Discussed with:   Anesthesia Plan Comments:        Anesthesia Quick Evaluation

## 2015-11-14 NOTE — H&P (Signed)
Faculty Practice H&P  Jamie Wilkinson is a 34 y.o. female [phone] with IUP at [redacted]w[redacted]d presenting for cesarean section for elective repeat. Pregnancy was been complicated by AMA and abnormal [place] with absent nasal bone.  NIPS was normal.    Pt states she has been having no contractions, no vaginal bleeding, intact membranes, with normal fetal movement.     Prenatal Course Source of Care: [facility]  with onset of care at 26 weeks Pregnancy complications or risks: Patient Active Problem List   Diagnosis Date Noted  . 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 Suspected fetal anomaly, antepartum 08/08/2015  . Asthma affecting pregnancy, antepartum 08/08/2015  . Supervision of high risk multigravida in second trimester in patient 35 years or older at time of delivery 08/08/2015  . Uterine scar from previous cesarean delivery affecting pregnancy 08/08/2015  . Advanced maternal age in multigravida 08/04/2015  . Asthma exacerbation 09/20/2014   She desires to neplanon.  She plans to plans to bottle feed  Prenatal labs and studies: ABO, Rh: --/--/O POS (11/25 1445) Antibody: POS (11/25 1445) Rubella: !Error! RPR: Non Reactive (11/25 1445)  HBsAg: NEGATIVE (08/31 1153)  HIV: NONREACTIVE (08/31 1153)  GBS: Positive (11/01 0000)  1 hr Glucola 126 Genetic screeningnormal Anatomy US abnormal with absent nasal bones  Past Medical History:  Past Medical History  Diagnosis Date  . Asthma     uses rescue inhaler as need-will bring dos  . GERD (gastroesophageal reflux disease)     only with pregnancy-uses tums prn  . Crohn's disease (HCC)   . Abnormal antibody titer     Positive Antibody KELL  . Blood transfusion     [facility]   . Kidney stone     Past Surgical History:  Past Surgical History  Procedure Laterality Date  . Cesarean section      three previous c/s   . Nasal endoscopy    . Lithotripsy  2005  . Colonoscopy    .  Cesarean section  10/19/2011    Procedure: CESAREAN SECTION;  Surgeon: Roseanna Rainbow, MD;  Location: [facility];  Service: Gynecology;  Laterality: N/A;  . Wisdom tooth extraction      Obstetrical History:  OB History    Gravida Para Term Preterm AB TAB SAB Ectopic Multiple Living   Gynecological History:  OB History    Gravida Para Term Preterm AB TAB SAB Ectopic Multiple Living   Social History:  Social History   Social History  . Marital Status: Married    Spouse Name: N/A  . Number of Children: N/A  . Years of Education: N/A   Social History Main Topics  . Smoking status: Former Smoker -- 7 years    Types: Cigarettes    Quit date: 03/16/2011  . Smokeless tobacco: Never Used  . Alcohol Use: No  . Drug Use: No  . Sexual Activity: Yes    Birth Control/ Protection: None   Other Topics Concern  . None   Social History Narrative    Family History:  Family History  Problem Relation Age of Onset  . Cancer Mother   . Diabetes Mother   . Heart disease Maternal Grandfather     Medications:  Prenatal vitamins,  Current Facility-Administered Medications  Medication  Dose Route Frequency Provider Last Rate Last Dose  . ceFAZolin (ANCEF) 2-3 GM-% IVPB SOLR           . ceFAZolin (ANCEF) IVPB 2 g/50 mL premix  2 g Intravenous On Call to OR Rhona Raider Stinson, DO      . ketorolac (TORADOL) 30 MG/ML injection 30 mg  30 mg Intravenous Q6H PRN Leilani Able, MD       Or  . ketorolac (TORADOL) 30 MG/ML injection 30 mg  30 mg Intramuscular Q6H PRN Leilani Able, MD      . lactated ringers infusion   Intravenous Continuous Leilani Able, MD      . lactated ringers infusion  125 mL/hr Intravenous Continuous Levie Heritage, DO 125 mL/hr at 11/14/15 0850 125 mL/hr at 11/14/15 0850  . scopolamine (TRANSDERM-SCOP) 1 MG/3DAYS 1.5 mg  1 patch Transdermal Once Leilani Able, MD   1.5 mg at 11/14/15 0850    Allergies:   Allergies  Allergen Reactions  . Remicade [Infliximab] Shortness Of Breath    Sob, heart racing during infusion    Review of Systems: - negative  Physical Exam: Blood pressure 133/90, pulse 88, temperature 97.6 F (36.4 C), temperature source Oral, resp. rate 20, SpO2 100 %. GENERAL: Well-developed, well-nourished female in no acute distress.  LUNGS: Clear to auscultation bilaterally.  HEART: Regular rate and rhythm. ABDOMEN: Soft, nontender, nondistended, gravid. EFW 8 lbs EXTREMITIES: Nontender, no edema, 2+ distal pulses. FHT:  Baseline rate 130 bpm     Pertinent Labs/Studies:   CBC    Component Value Date/Time   WBC 12.3* 11/11/2015 1445   RBC 3.82* 11/11/2015 1445   HGB 11.7* 11/11/2015 1445   HCT 34.5* 11/11/2015 1445   PLT 163 11/11/2015 1445   MCV 90.3 11/11/2015 1445   MCH 30.6 11/11/2015 1445   MCHC 33.9 11/11/2015 1445   RDW 13.4 11/11/2015 1445   LYMPHSABS 2.9 08/17/2015 1153   MONOABS 0.6 08/17/2015 1153   EOSABS 0.2 08/17/2015 1153   BASOSABS 0.0 08/17/2015 1153      Assessment : Jamie Wilkinson is a 34 y.o. [id number] at [redacted]w[redacted]d being admitted for cesarean section secondary to elective repeat  Plan: The risks of cesarean section discussed with the patient included but were not limited to: bleeding which may require transfusion or reoperation; infection which may require antibiotics; injury to bowel, bladder, ureters or other surrounding organs; injury to the fetus; need for additional procedures including hysterectomy in the event of a life-threatening hemorrhage; placental abnormalities wth subsequent pregnancies, incisional problems, thromboembolic phenomenon and other postoperative/anesthesia complications. The patient concurred with the proposed plan, giving informed written consent for the procedure.   Patient has been NPO since last night and will remain NPO for procedure.  Preoperative prophylactic Ancef ordered on call to the OR.    Levie Heritage, DO 11/14/2015, 9:02 AM

## 2015-11-14 NOTE — Anesthesia Postprocedure Evaluation (Signed)
Anesthesia Post Note  Patient: Jamie Wilkinson  Procedure(s) Performed: Procedure(s) (LRB): CESAREAN SECTION (N/A)  Patient location during evaluation: Mother Baby Anesthesia Type: Spinal Level of consciousness: awake and alert and oriented Pain management: pain level controlled Vital Signs Assessment: post-procedure vital signs reviewed and stable Respiratory status: spontaneous breathing Cardiovascular status: stable Postop Assessment: No headache, No backache, Patient able to bend at knees and No signs of nausea or vomiting Anesthetic complications: no    Last Vitals:  Filed Vitals:   11/14/15 1500 11/14/15 1530  BP: 121/80 121/79  Pulse: 64 64  Temp: 36.4 C 36.7 C  Resp: 18 18    Last Pain:  Filed Vitals:   11/14/15 1622  PainSc: 3                  Melodi Happel

## 2015-11-14 NOTE — Anesthesia Postprocedure Evaluation (Signed)
Anesthesia Post Note  Patient: Jamie Wilkinson  Procedure(s) Performed: Procedure(s) (LRB): CESAREAN SECTION (N/A)  Patient location during evaluation: PACU Anesthesia Type: Spinal Level of consciousness: oriented and awake and alert Pain management: pain level controlled Vital Signs Assessment: post-procedure vital signs reviewed and stable Respiratory status: spontaneous breathing, respiratory function stable and nonlabored ventilation Cardiovascular status: blood pressure returned to baseline and stable Postop Assessment: No backache, No signs of nausea or vomiting, Spinal receding, Patient able to bend at knees and No headache Anesthetic complications: no    Last Vitals:  Filed Vitals:   11/14/15 1130 11/14/15 1145  BP: 121/83 118/85  Pulse: 88 75  Temp:    Resp: 19 15    Last Pain:  Filed Vitals:   11/14/15 1159  PainSc: 0-No pain                 Kathrene Sinopoli A.

## 2015-11-14 NOTE — Op Note (Signed)
Jamie Wilkinson PROCEDURE DATE: 11/14/2015  PREOPERATIVE DIAGNOSIS: Intrauterine pregnancy at  [redacted]w[redacted]d weeks gestation; previous uterine incision kerr x3 or greater  POSTOPERATIVE DIAGNOSIS: The same  PROCEDURE: Repeat Low Transverse Cesarean Section  SURGEON:  Dr. Candelaria Celeste  ASSISTANT: Dr Nicholaus Bloom  INDICATIONS: Jamie Wilkinson is a 34 y.o. [id number] at [redacted]w[redacted]d scheduled for cesarean section secondary to previous uterine incision kerr x3 or greater.  The risks of cesarean section discussed with the patient included but were not limited to: bleeding which may require transfusion or reoperation; infection which may require antibiotics; injury to bowel, bladder, ureters or other surrounding organs; injury to the fetus; need for additional procedures including hysterectomy in the event of a life-threatening hemorrhage; placental abnormalities wth subsequent pregnancies, incisional problems, thromboembolic phenomenon and other postoperative/anesthesia complications. The patient concurred with the proposed plan, giving informed written consent for the procedure.    FINDINGS:  Viable Female infant in veretx presentation.  Apgars 8 and 9, weight pending.  Clear amniotic fluid.  Intact placenta, three vessel cord.  Normal uterus, fallopian tubes and ovaries bilaterally.  ANESTHESIA:    Spinal INTRAVENOUS FLUIDS:2300 ml ESTIMATED BLOOD LOSS: 700 ml URINE OUTPUT:  300 ml SPECIMENS: Placenta sent to L&D COMPLICATIONS: None immediate  PROCEDURE IN DETAIL:  The patient received intravenous antibiotics and had sequential compression devices applied to her lower extremities while in the preoperative area.  She was then taken to the operating room where spinal anesthesia was administered and was found to be adequate. She was then placed in a dorsal supine position with a leftward tilt, and prepped and draped in a sterile manner.  A foley catheter was placed into her bladder and attached to constant  gravity, which drained clear fluid throughout.  After an adequate timeout was performed, a vertical midline skin incision was made with scalpel and carried through to the underlying layer of fascia.  Of note, there was a large amount of edema in the subcutaneous layer. The fascia was incised in the midline and Kocher clamps were applied on each edge, tented up and the incision was extended vertically and caudally using the Mayo scissors. The rectus muscles were separated in the midline bluntly. A bladder blade was placed to aid in visualization of the uterus.  Attention was turned to the lower uterine segment where a transverse hysterotomy was made with a scalpel and extended bilaterally bluntly. The infant was successfully delivered.  After a minute of delayed cord clamping, the cord was clamped and cut and infant was handed over to awaiting neonatology team. Uterine massage was then administered and the placenta delivered intact with three-vessel cord. The uterus was then cleared of clot and debris.  The hysterotomy was closed with 0 Vicryl in a running locked fashion, and an imbricating layer was also placed with a 0 Vicryl. Overall, excellent hemostasis was noted. The abdomen and the pelvis were cleared of all clot and debris and the bladder blade was removed. Hemostasis was confirmed on all surfaces.  The peritoneum was reapproximated using 2-0 vicryl running stitches. The fascia was then closed using 0 Vicryl in a running fashion.  The subcutaneous layer was reapproximated with plain gut and the skin was closed with 2-0 vicryl. The patient tolerated the procedure well. Sponge, lap, instrument and needle counts were correct x 2. She was taken to the recovery room in stable condition.    Jamie Heritage, DO 11/14/2015 10:35 AM

## 2015-11-15 ENCOUNTER — Encounter (HOSPITAL_COMMUNITY): Payer: Self-pay | Admitting: Family Medicine

## 2015-11-15 LAB — TYPE AND SCREEN
ABO/RH(D): O POS
ANTIBODY SCREEN: POSITIVE
DAT, IGG: NEGATIVE
Unit division: 0
Unit division: 0

## 2015-11-15 LAB — BIRTH TISSUE RECOVERY COLLECTION (PLACENTA DONATION)

## 2015-11-15 LAB — CBC
HEMATOCRIT: 28.1 % — AB (ref 36.0–46.0)
HEMOGLOBIN: 9.6 g/dL — AB (ref 12.0–15.0)
MCH: 31.1 pg (ref 26.0–34.0)
MCHC: 34.2 g/dL (ref 30.0–36.0)
MCV: 90.9 fL (ref 78.0–100.0)
Platelets: 141 10*3/uL — ABNORMAL LOW (ref 150–400)
RBC: 3.09 MIL/uL — ABNORMAL LOW (ref 3.87–5.11)
RDW: 13.7 % (ref 11.5–15.5)
WBC: 10.9 10*3/uL — ABNORMAL HIGH (ref 4.0–10.5)

## 2015-11-15 MED ORDER — PNEUMOCOCCAL VAC POLYVALENT 25 MCG/0.5ML IJ INJ
0.5000 mL | INJECTION | INTRAMUSCULAR | Status: DC
  Filled 2015-11-15: qty 0.5

## 2015-11-15 NOTE — Progress Notes (Signed)
Post Partum Day 1  Subjective:  Jamie Wilkinson is a 34 y.o. [id number] [redacted]w[redacted]d s/p rLTCS.  No acute events overnight.  Pt denies problems with ambulating or po intake. Patient has not urinated yet as she just had urinary catheter removed.   She states immediately after surgery she vomitted 3 x times, but since has no issues with n/v.  Pain is moderately controlled.  She has had flatus. She has not had bowel movement.  Lochia Moderate.  Plan for birth control is vasectomy, vs Nexplanon .  Method of Feeding: Bottle   Objective: BP 118/63 mmHg  Pulse 90  Temp(Src) 98 F (36.7 C) (Oral)  Resp 16  Ht 5\' 4"  (1.626 m)  Wt 225 lb (102.059 kg)  BMI 38.60 kg/m2  SpO2 97%  LMP   Breastfeeding? Unknown  Physical Exam:  General: alert, cooperative and no distress Lochia:normal flow Chest: CTAB Heart: RRR no m/r/g Abdomen: +BS, soft, nontender, fundus firm at/below umbilicus Uterine Fundus: firm, patient with minimal discharge from the dressing.  DVT Evaluation: No evidence of DVT seen on physical exam. Extremities: no lower extremity edema   Recent Labs  11/15/15 0545  HGB 9.6*  HCT 28.1*    Assessment/Plan:  ASSESSMENT: Jamie Wilkinson is a 34 y.o. [id number] [redacted]w[redacted]d ppd #1 s/p rLTCS doing well.    Continue postpartum management   LOS: 1 day   Asiyah Z Mikell 11/15/2015, 7:50 AM   OB fellow attestation Post Partum Day 1 I have seen and examined this patient and agree with above documentation in the resident's note.   Jamie Wilkinson is a 34 y.o. [id number] s/p rLTCS#.  Pt denies problems with ambulating, voiding or po intake. Pain is well controlled.  Plan for birth control is vasectomy, Nexplanon.  Method of Feeding: Breast  PE:  BP 105/59 mmHg  Pulse 88  Temp(Src) 98.3 F (36.8 C) (Oral)  Resp 18  Ht 5\' 4"  (1.626 m)  Wt 225 lb (102.059 kg)  BMI 38.60 kg/m2  SpO2 98%  LMP   Breastfeeding? Unknown Gen: well appearing Heart: reg rate Lungs: normal WOB Fundus  firm Ext: soft, no pain, no edema  Plan for discharge: Doing well today. Continue PP care  Federico Flake, MD 2:34 PM

## 2015-11-16 LAB — URINALYSIS W MICROSCOPIC (NOT AT ARMC)
BACTERIA UA: NONE SEEN
BILIRUBIN URINE: NEGATIVE
Glucose, UA: NEGATIVE mg/dL
Ketones, ur: NEGATIVE mg/dL
Leukocytes, UA: NEGATIVE
NITRITE: NEGATIVE
PH: 6 (ref 5.0–8.0)
Protein, ur: 30 mg/dL — AB
SPECIFIC GRAVITY, URINE: 1.02 (ref 1.005–1.030)

## 2015-11-16 NOTE — Progress Notes (Signed)
Post Op Day 2 Subjective: no complaints, up ad lib, voiding, tolerating PO and + flatus. Says she still has some pain, and would like to stay one more day to make sure that things continue to go well. She has had a  BM. Otherwise, no complaints.   Objective: Blood pressure 121/72, pulse 93, temperature 98.2 F (36.8 C), temperature source Oral, resp. rate 18, height  (1.626 m), weight 102.059 kg (225 lb), SpO2 97 %, unknown if currently breastfeeding.  Physical Exam:  General: alert, cooperative and no distress Lochia: appropriate Uterine Fundus: firm Incision: healing well, no significant drainage, no dehiscence, no significant erythema DVT Evaluation: No evidence of DVT seen on physical exam.   Recent Labs  11/15/15 0545  HGB 9.6*  HCT 28.1*    Assessment/Plan: Plan for discharge tomorrow, Breastfeeding and Lactation consult  Continue current care Breast feeding Considering vasectomy vs. nexplanon for BC.    LOS: 2 days   Jamie Wilkinson 11/16/2015, 7:09 AM   OB fellow attestation Post Partum Day /POD#2 I have seen and examined this patient and agree with above documentation in the resident's note.   Jamie Wilkinson is a 34 y.o. [id number] s/p rLTCS.  Pt denies problems with ambulating, voiding or po intake. Pain is well controlled.  Plan for birth control is undecided.  Method of Feeding: bottle/formula  PE:  BP 121/72 mmHg  Pulse 93  Temp(Src) 98.2 F (36.8 C) (Oral)  Resp 18  Ht  (1.626 m)  Wt 225 lb (102.059 kg)  BMI 38.60 kg/m2  SpO2 97%  LMP   Breastfeeding? Unknown Gen: well appearing Heart: reg rate Lungs: normal WOB Fundus firm Ext: soft, no pain, no edema  Plan for discharge: POD#3 Continue standard pp care  Federico Flake, MD 8:03 AM

## 2015-11-17 MED ORDER — IBUPROFEN 600 MG PO TABS: 600.0000 mg | ORAL_TABLET | Freq: Four times a day (QID) | ORAL | Status: DC | PRN

## 2015-11-17 MED ORDER — OXYCODONE-ACETAMINOPHEN 5-325 MG PO TABS: 1.0000 | ORAL_TABLET | ORAL | Status: DC | PRN

## 2015-11-17 NOTE — Discharge Summary (Signed)
OB Discharge Summary     Patient Name: Jamie Wilkinson DOB: 09-20-1981 MRN: [id number]  Date of admission: 11/14/2015 Delivering MD: Levie Heritage   Date of discharge: 11/17/2015  Admitting diagnosis: cpt [phone] - REPEAT C Section Intrauterine pregnancy: [redacted]w[redacted]d     Secondary diagnosis:  Principal Problem:   Status post cesarean section Active Problems:   Advanced maternal age in multigravida   Suspected fetal anomaly, antepartum   Asthma affecting pregnancy, antepartum   Supervision of high risk multigravida in second trimester in patient 35 years or older at time of delivery   Uterine scar from previous cesarean delivery affecting pregnancy   Maternal atypical antibody complicating pregnancy in second trimester  Additional problems: None      Discharge diagnosis: Term Pregnancy Delivered for an elective C-section                                                                                                 Post partum procedures:None  Complications: None  Hospital course:  Sceduled C/S   34 y.o. yo [id number] at [redacted]w[redacted]d was admitted to the hospital 11/14/2015 for scheduled cesarean section with the following indication:Elective Repeat.   Membrane Rupture Time/Date: 9:58 AM ,11/14/2015   Patient delivered a Viable infant.11/14/2015  Details of operation can be found in separate operative note.  Pateint had an uncomplicated postpartum course.  She is ambulating, tolerating a regular diet, passing flatus, and urinating well. Patient is discharged home in stable condition on 11/17/2015   Physical exam  Filed Vitals:   11/15/15 1811 11/16/15 0511 11/16/15 1830 11/17/15 0541  BP: 107/60 121/72 107/60 123/71  Pulse: 87 93 81 94  Temp: 98.1 F (36.7 C) 98.2 F (36.8 C) 98.1 F (36.7 C) 97.7 F (36.5 C)  TempSrc: Oral Oral Oral Oral  Resp: 18 18 18 18   Height:      Weight:      SpO2:  97%  94%   General: alert, cooperative and no distress Lochia: appropriate Uterine  Fundus: firm Incision: Dressing is clean, dry, and intact DVT Evaluation: No evidence of DVT seen on physical exam. Labs: Lab Results  Component Value Date   WBC 10.9* 11/15/2015   HGB 9.6* 11/15/2015   HCT 28.1* 11/15/2015   MCV 90.9 11/15/2015   PLT 141* 11/15/2015   CMP Latest Ref Rng 09/21/2014  Glucose 70 - 99 mg/dL 423(N)  BUN 6 - 23 mg/dL 11  Creatinine 3.61 - 4.43 mg/dL 1.54  Sodium 008 - 676 mEq/L 136(L)  Potassium 3.7 - 5.3 mEq/L 4.2  Chloride 96 - 112 mEq/L 101  CO2 19 - 32 mEq/L 24  Calcium 8.4 - 10.5 mg/dL 9.6  Total Protein 6.0 - 8.3 g/dL 7.1  Total Bilirubin 0.3 - 1.2 mg/dL <1.9(J)  Alkaline Phos 39 - 117 U/L 61  AST 0 - 37 U/L 7  ALT 0 - 35 U/L 10    Discharge instruction: per After Visit Summary and "Baby and Me Booklet".  After visit meds:    Medication List    STOP taking these medications  acetaminophen 500 MG tablet  Commonly known as:  TYLENOL      TAKE these medications        albuterol 108 (90 BASE) MCG/ACT inhaler  Commonly known as:  PROVENTIL HFA;VENTOLIN HFA  Inhale 2 puffs into the lungs every 6 (six) hours as needed. Shortness of breath     calcium carbonate 750 MG chewable tablet  Commonly known as:  TUMS EX  Chew 2 tablets by mouth daily as needed.     CLEAR EYES COMPLETE Soln  Place 1 drop into both eyes daily as needed (dry eyes).     CONCEPT OB 130-92.4-1 MG Caps  Take 1 tablet by mouth daily.     ibuprofen 600 MG tablet  Commonly known as:  ADVIL,MOTRIN  Take 1 tablet (600 mg total) by mouth every 6 (six) hours as needed for mild pain.     ipratropium-albuterol 0.5-2.5 (3) MG/3ML Soln  Commonly known as:  DUONEB  Take 3 mLs by nebulization every 6 (six) hours as needed.     oxyCODONE-acetaminophen 5-325 MG tablet  Commonly known as:  PERCOCET/ROXICET  Take 1 tablet by mouth every 4 (four) hours as needed (for pain scale 4-7).        Diet: routine diet  Activity: Advance as tolerated. Pelvic rest for 6  weeks.   Outpatient follow up:6 weeks Follow up Appt:No future appointments. Follow up Visit:No Follow-up on file.  Postpartum contraception: Vasectomy  Newborn Data: Live born female  Birth Weight: 6 lb 10.7 oz (3025 g) APGAR: 8, 9  Baby Feeding: Bottle Disposition:home with mother   11/17/2015 Danella Maiers, MD   CNM attestation I have seen and examined this patient and agree with above documentation in the resident's note.   REENE HARLACHER is a 34 y.o. [id number] s/p rLTCS.   Pain is well controlled.  Plan for birth control is vasectomy.  Method of Feeding: bottle  PE:  BP 123/71 mmHg  Pulse 94  Temp(Src) 97.7 F (36.5 C) (Oral)  Resp 18  Ht  (1.626 m)  Wt 102.059 kg (225 lb)  BMI 38.60 kg/m2  SpO2 94%  LMP   Breastfeeding? Unknown Fundus firm  No results for input(s): HGB, HCT in the last 72 hours.   Plan: discharge today - postpartum care discussed - f/u clinic in 6 weeks for postpartum visit   Cam Hai, CNM 10:13 PM  11/18/2015

## 2015-11-17 NOTE — Discharge Instructions (Signed)
Cesarean Delivery, Care After  Refer to this sheet in the next few weeks. These instructions provide you with information on caring for yourself after your procedure. Your health care provider may also give you specific instructions. Your treatment has been planned according to current medical practices, but problems sometimes occur. Call your health care provider if you have any problems or questions after you go home.  HOME CARE INSTRUCTIONS   Only take over-the-counter or prescription medications as directed by your health care provider.   Do not drink alcohol, especially if you are breastfeeding or taking medication to relieve pain.   Do not chew or smoke tobacco.   Continue to use good perineal care. Good perineal care includes:    Wiping your perineum from front to back.    Keeping your perineum clean.   Check your surgical cut (incision) daily for increased redness, drainage, swelling, or separation of skin.   Clean your incision gently with soap and water every day, and then pat it dry. If your health care provider says it is okay, leave the incision uncovered. Use a bandage (dressing) if the incision is draining fluid or appears irritated. If the adhesive strips across the incision do not fall off within 7 days, carefully peel them off.   Hug a pillow when coughing or sneezing until your incision is healed. This helps to relieve pain.   Do not use tampons or douche until your health care provider says it is okay.   Shower, wash your hair, and take tub baths as directed by your health care provider.   Wear a well-fitting bra that provides breast support.   Limit wearing support panties or control-top hose.   Drink enough fluids to keep your urine clear or pale yellow.   Eat high-fiber foods such as whole grain cereals and breads, brown rice, beans, and fresh fruits and vegetables every day. These foods may help prevent or relieve constipation.   Resume activities such as climbing stairs,  driving, lifting, exercising, or traveling as directed by your health care provider.   Talk to your health care provider about resuming sexual activities. This is dependent upon your risk of infection, your rate of healing, and your comfort and desire to resume sexual activity.   Try to have someone help you with your household activities and your newborn for at least a few days after you leave the hospital.   Rest as much as possible. Try to rest or take a nap when your newborn is sleeping.   Increase your activities gradually.   Keep all of your scheduled postpartum appointments. It is very important to keep your scheduled follow-up appointments. At these appointments, your health care provider will be checking to make sure that you are healing physically and emotionally.  SEEK MEDICAL CARE IF:    You are passing large clots from your vagina. Save any clots to show your health care provider.   You have a foul smelling discharge from your vagina.   You have trouble urinating.   You are urinating frequently.   You have pain when you urinate.   You have a change in your bowel movements.   You have increasing redness, pain, or swelling near your incision.   You have pus draining from your incision.   Your incision is separating.   You have painful, hard, or reddened breasts.   You have a severe headache.   You have blurred vision or see spots.   You feel sad   or depressed.   You have thoughts of hurting yourself or your newborn.   You have questions about your care, the care of your newborn, or medications.   You are dizzy or light-headed.   You have a rash.   You have pain, redness, or swelling at the site of the removed intravenous access (IV) tube.   You have nausea or vomiting.   You stopped breastfeeding and have not had a menstrual period within 12 weeks of stopping.   You are not breastfeeding and have not had a menstrual period within 12 weeks of delivery.   You have a fever.  SEEK  IMMEDIATE MEDICAL CARE IF:   You have persistent pain.   You have chest pain.   You have shortness of breath.   You faint.   You have leg pain.   You have stomach pain.   Your vaginal bleeding saturates 2 or more sanitary pads in 1 hour.  MAKE SURE YOU:    Understand these instructions.   Will watch your condition.   Will get help right away if you are not doing well or get worse.     This information is not intended to replace advice given to you by your health care provider. Make sure you discuss any questions you have with your health care provider.     Document Released: 08/25/2002 Document Revised: 12/24/2014 Document Reviewed: 07/30/2012  Elsevier Interactive Patient Education 2016 [employer]

## 2015-12-26 ENCOUNTER — Ambulatory Visit: Payer: Medicaid Other | Admitting: Obstetrics & Gynecology

## 2016-01-10 ENCOUNTER — Ambulatory Visit: Payer: Medicaid Other | Admitting: Advanced Practice Midwife

## 2016-01-17 ENCOUNTER — Encounter: Payer: Self-pay | Admitting: Advanced Practice Midwife

## 2016-01-17 ENCOUNTER — Ambulatory Visit (INDEPENDENT_AMBULATORY_CARE_PROVIDER_SITE_OTHER): Payer: Medicaid Other | Admitting: Advanced Practice Midwife

## 2016-01-17 VITALS — BP 129/98 | HR 102 | Wt 197.5 lb

## 2016-01-17 DIAGNOSIS — R0989 Other specified symptoms and signs involving the circulatory and respiratory systems: Secondary | ICD-10-CM | POA: Insufficient documentation

## 2016-01-17 MED ORDER — ALBUTEROL SULFATE HFA 108 (90 BASE) MCG/ACT IN AERS: 2.0000 | INHALATION_SPRAY | Freq: Four times a day (QID) | RESPIRATORY_TRACT | Status: DC | PRN

## 2016-01-17 MED ORDER — NORETHINDRONE 0.35 MG PO TABS: 1.0000 | ORAL_TABLET | Freq: Every day | ORAL | Status: DC

## 2016-01-17 NOTE — Progress Notes (Signed)
  Subjective:     Jamie Wilkinson is a 35 y.o. female who presents for a postpartum visit. She is 9 weeks postpartum following a low cervical transverse Cesarean section. I have fully reviewed the prenatal and intrapartum course. The delivery was at term gestational weeks. Outcome: repeat cesarean section, low transverse incision. Anesthesia: regional. Postpartum course has been uneventful. Baby's course has been uneventful. Baby is feeding by breast. Bleeding no bleeding. Bowel function is normal. Bladder function is normal. Patient is not sexually active. Contraception method is oral progesterone-only contraceptive. Postpartum depression screening: negative.  The following portions of the patient's history were reviewed and updated as appropriate: allergies, current medications, past family history, past medical history, past social history, past surgical history and problem list.  Review of Systems Pertinent items are noted in HPI.   Objective:    BP 129/98 mmHg  Pulse 102  Wt 197 lb 8 oz (89.585 kg)  General:  alert, cooperative and no distress   Breasts:  inspection negative, no nipple discharge or bleeding, no masses or nodularity palpable  Lungs: clear to auscultation bilaterally  Heart:  regular rate and rhythm, S1, S2 normal, no murmur, click, rub or gallop  Abdomen: soft, non-tender; bowel sounds normal; no masses,  no organomegaly   Vulva:  not evaluated  Vagina: not evaluated  Cervix:  n/a  Corpus: not examined  Adnexa:  not evaluated  Rectal Exam: Not performed.        Assessment:     Normal postpartum exam. Pap smear not done at today's visit.   Plan:    1. Contraception: oral progesterone-only contraceptive 2. Saw her elevated BP after she left. Will have nurses bring her in for BP check this week. May need chronic meds, since she is out of the window for preeclampsia.  Reported she has had HTN off and on.  3. Follow up in: several days for BP check or as needed.

## 2016-01-17 NOTE — Patient Instructions (Signed)

## 2016-01-18 ENCOUNTER — Telehealth: Payer: Self-pay

## 2016-01-18 NOTE — Telephone Encounter (Signed)
My chart message to patient

## 2016-01-18 NOTE — Telephone Encounter (Signed)
Per Hilda Lias patient needs to returned to clinic for a blood pressure check. I have left patient a message concerning recommendation.

## 2016-01-20 NOTE — Telephone Encounter (Signed)
Called pt and left message that she needs appt for BP check. Please call back to schedule the appt.

## 2016-03-05 IMAGING — US US MFM OB FOLLOW-UP
1 series · 12 of 28 positions shown · non-contrast
Comparison: none

[Series 1: us mfm ob follow-up · 0.23mm/px · 12 of 61 slices shown]
[im 3/61]
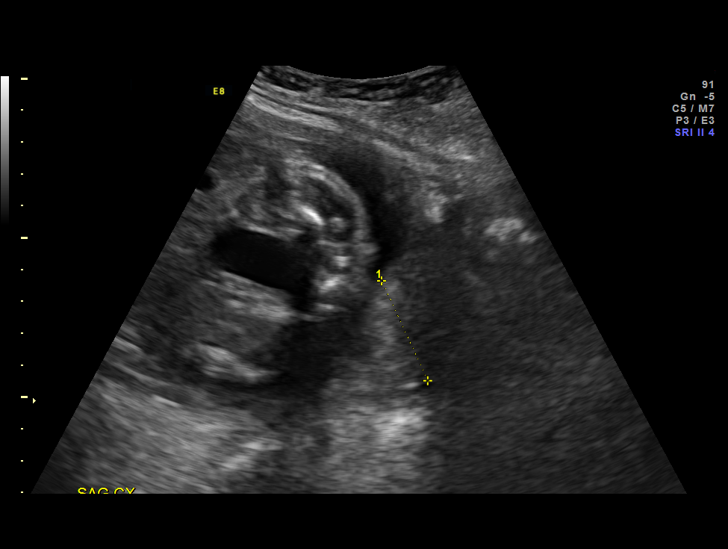
[im 7/61]
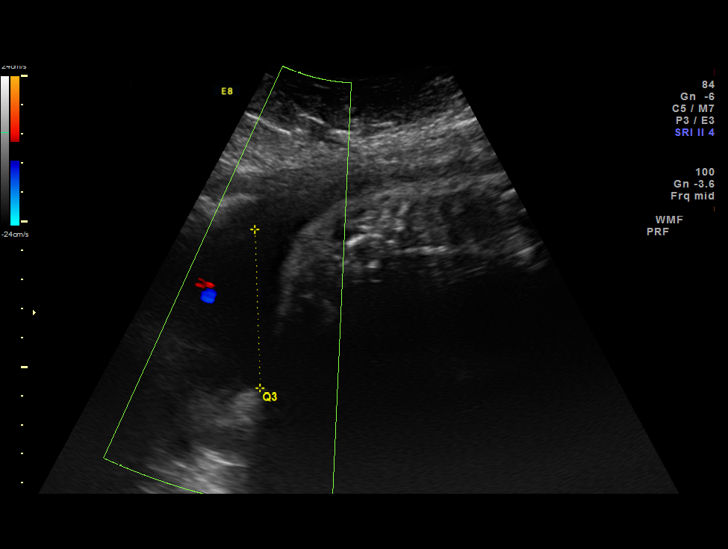
[im 12/61]
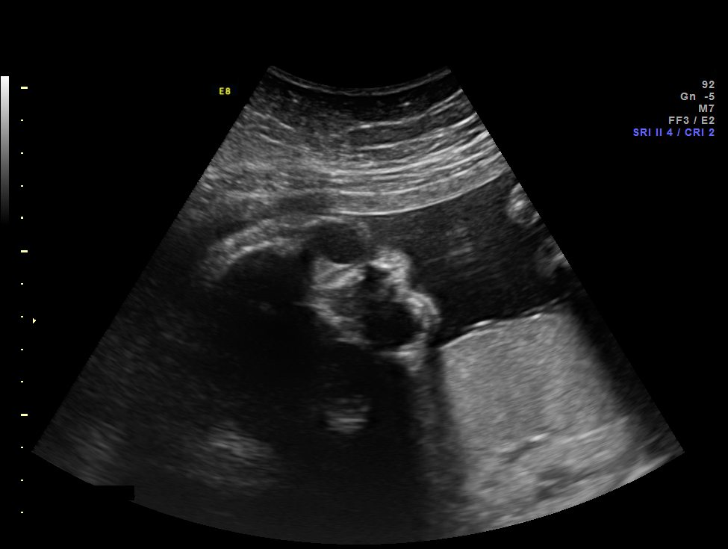
[im 18/61]
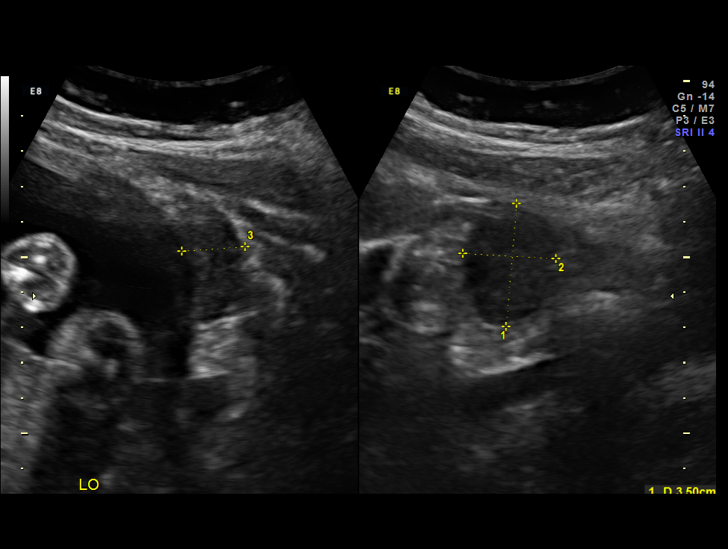
[im 23/61]
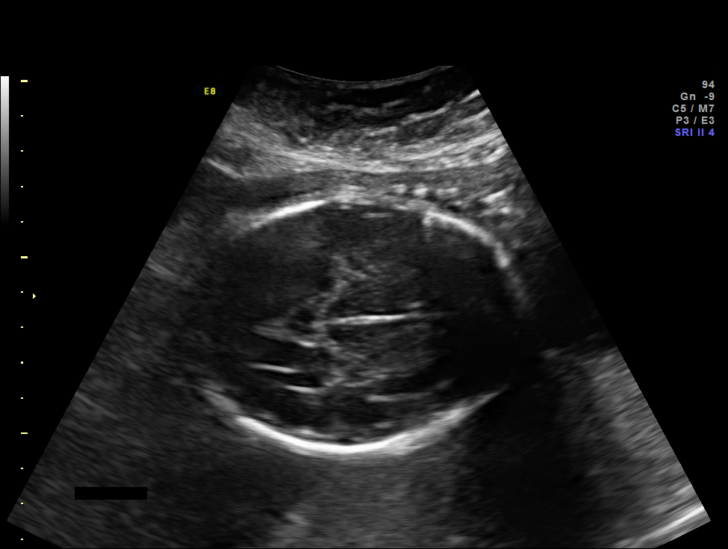
[im 27/61]
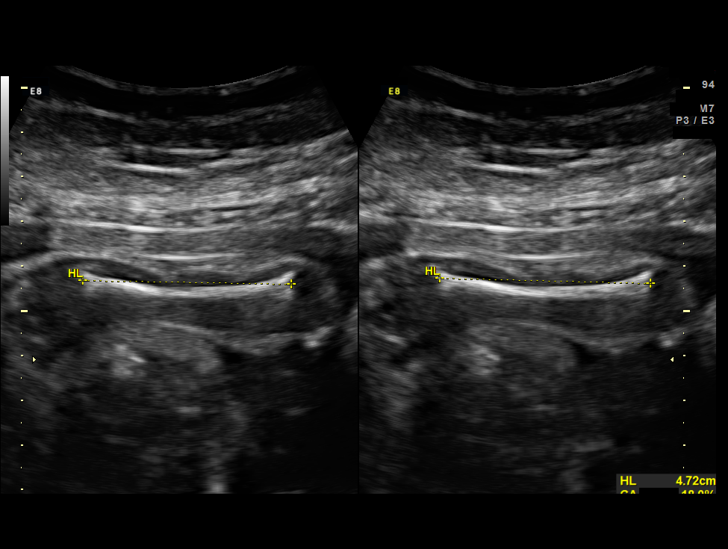
[im 34/61]
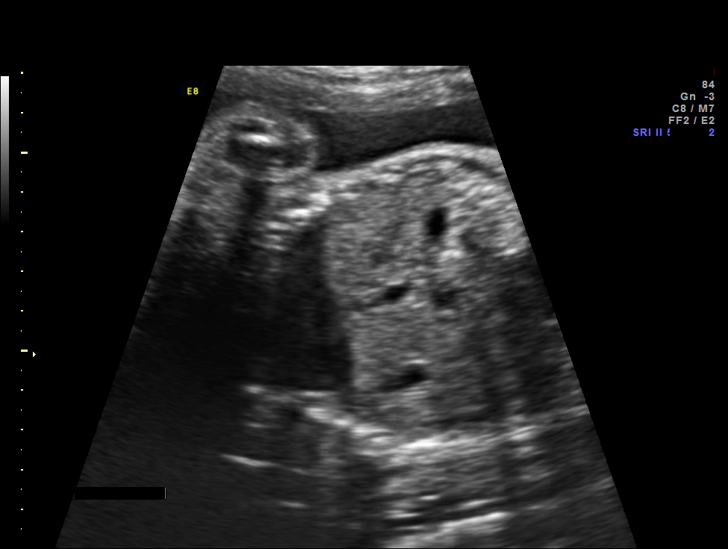
[im 38/61]
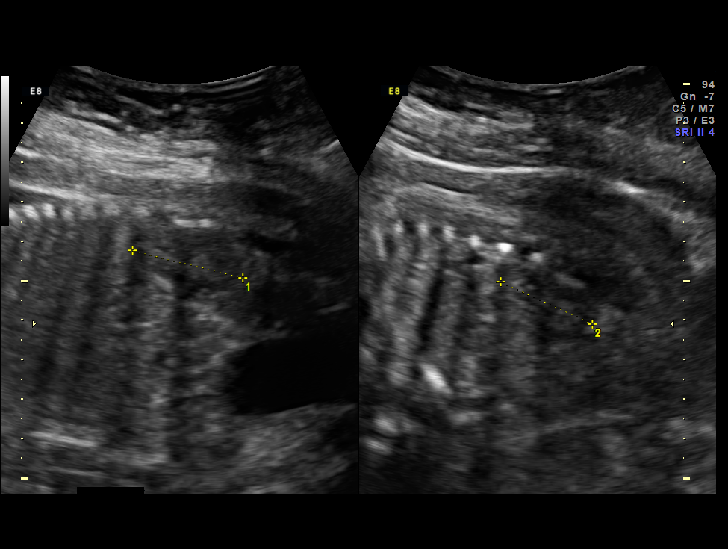
[im 43/61]
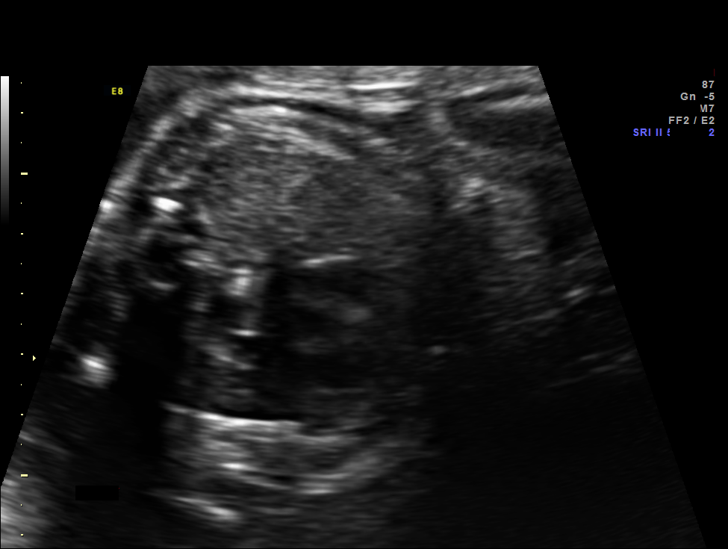
[im 49/61]
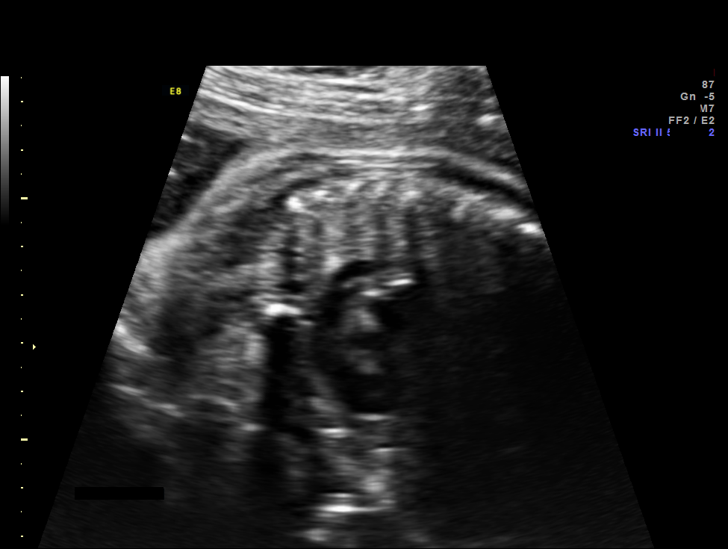
[im 54/61]
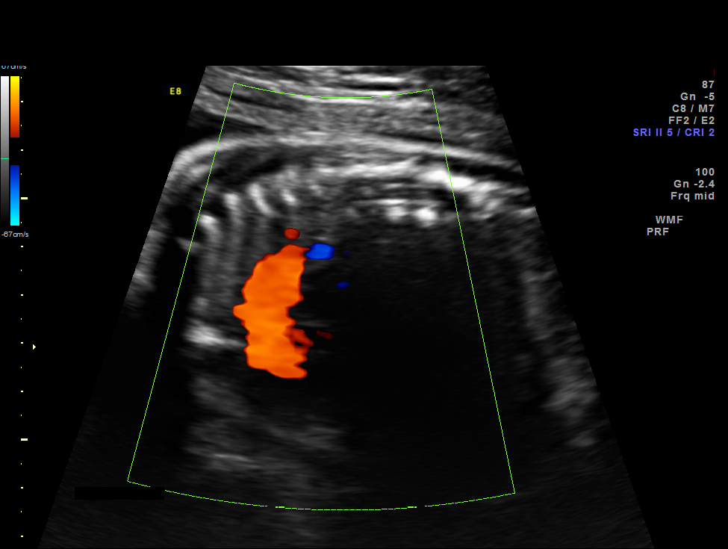
[im 58/61]
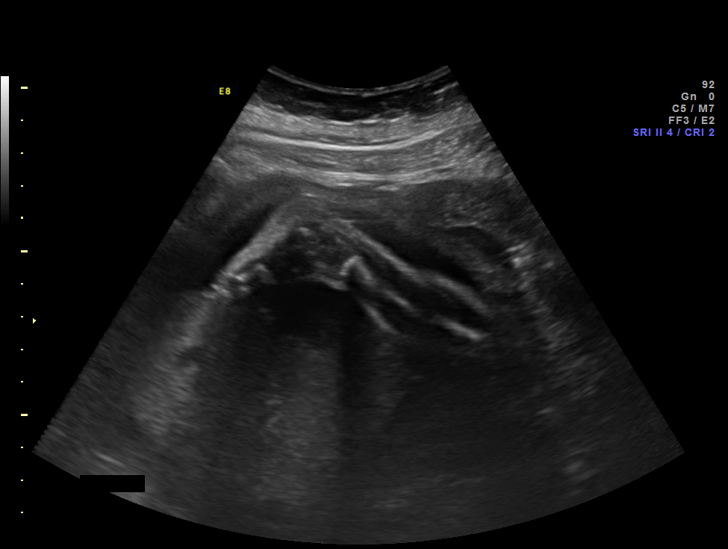

[12 of 28 positions shown; findings below may reference images not displayed]

OBSTETRICS REPORT
(Signed Final 09/01/2015 [DATE])

Date:

Faculty Physician
Service(s) Provided

Indications

Advanced maternal age multigravida 34 at
delivery (normal NIPS)
Tobacco use complicating pregnancy(Former
Quit Date 03/16/11)
Asthma                                                 [facility] j07.262
Previous cesarean section x3
Follow-up incomplete fetal anatomic evaluation         Z36
28 weeks gestation of pregnancy
Fetal Evaluation

Num Of             1
Fetuses:
Fetal Heart        161                          bpm
Rate:
Cardiac Activity:  Observed
Presentation:      Breech
Placenta:          Posterior, above cervical
os
P. Cord            Previously Visualized
Insertion:

Amniotic Fluid
AFI FV:      Subjectively within normal limits
AFI Sum:     23.87    cm      96  %Tile     Larg Pckt:    8.21   cm
RUQ:   4.13    cm    RLQ:   6.07    cm   LUQ:    8.21    cm   LLQ:    5.46   cm
Biometry

BPD:     71.2   m    G. Age:   28w 4d                 CI:         73.9   70 - 86
m
OFD:     96.3   m                                     FL/HC:      19.8   19.6 -
m
HC:     269.8   m    G. Age:   29w 3d        39  %    HC/AC:      1.08   0.99 -
m
AC:     249.6   m    G. Age:   29w 1d        57  %    FL/BPD      75.1   71 - 87
m                                     :
FL:      53.5   m    G. Age:   28w 3d        26  %    FL/AC:      21.4   20 - 24
m
HUM:       47   m    G. Age:   27w 5d        23  %
m

Est.        8908   gm   2 lb 14 oz      54   %
FW:
Gestational Age

U/S Today:     28w 6d                                         EDD:   11/18/15
Best:          28w 5d    Det. By:   Early Ultrasound          EDD:   11/19/15
(04/24/15)
Anatomy

Cranium:          Appears normal         Aortic Arch:       Appears normal
Fetal Cavum:      Appears normal         Ductal Arch:       Appears normal
Ventricles:       Appears normal         Diaphragm:         Appears normal
Choroid Plexus:   Appears normal         Stomach:           Appears normal,
left sided
Cerebellum:       Appears normal         Abdomen:           Appears normal
Posterior         Appears normal         Abdominal          Previously seen
Fossa:                                   Wall:
Nuchal Fold:      Not applicable (>20    Cord Vessels:      Previously seen
wks GA)
Face:             Absent nasal           Kidneys:           Appear normal
bone
Lips:             Appears normal         Bladder:           Appears normal
Heart:            Appears normal         Spine:             Previously seen
(4CH, axis, and
situs)
RVOT:             Appears normal         Lower              Previously seen
Extremities:
LVOT:             Appears normal         Upper              Previously seen
Extremities:

Other:   Male gender.Heels visualized. Technically difficult due to fetal
position.
Cervix Uterus Adnexa

Cervical Length:    3.4       cm

Cervix:       Normal appearance by transabdominal scan.
Uterus:       No abnormality visualized.
Cul De Sac:   No free fluid seen.

Left Ovary:    Within normal limits.
Right Ovary:   Within normal limits.

Adnexa:     No abnormality visualized.
Impression

Single living intrauterine pregnancy at 28 weeks 5 days.
Appropriate fetal growth (54%).
Normal amniotic fluid volume.
The fetal anatomic survey is complete.
Normal fetal anatomy.
No fetal anomalies or soft markers of aneuploidy seen.
Recommendations

Follow-up ultrasounds as clinically indicated.

## 2018-02-24 ENCOUNTER — Encounter (HOSPITAL_COMMUNITY): Payer: Self-pay | Admitting: Emergency Medicine

## 2018-02-24 ENCOUNTER — Emergency Department (HOSPITAL_COMMUNITY)
Admission: EM | Admit: 2018-02-24 | Discharge: 2018-02-24 | Disposition: A | Discharge status: Home / Self Care | Payer: Self-pay | Attending: Physician Assistant | Admitting: Physician Assistant

## 2018-02-24 DIAGNOSIS — Z79899 Other long term (current) drug therapy: Secondary | ICD-10-CM | POA: Insufficient documentation

## 2018-02-24 DIAGNOSIS — N390 Urinary tract infection, site not specified: Secondary | ICD-10-CM | POA: Insufficient documentation

## 2018-02-24 DIAGNOSIS — J45909 Unspecified asthma, uncomplicated: Secondary | ICD-10-CM | POA: Insufficient documentation

## 2018-02-24 DIAGNOSIS — Z87891 Personal history of nicotine dependence: Secondary | ICD-10-CM | POA: Insufficient documentation

## 2018-02-24 LAB — URINALYSIS, ROUTINE W REFLEX MICROSCOPIC
Bilirubin Urine: NEGATIVE
Glucose, UA: NEGATIVE mg/dL
KETONES UR: 5 mg/dL — AB
Nitrite: NEGATIVE
PH: 5 (ref 5.0–8.0)
Protein, ur: 100 mg/dL — AB
Specific Gravity, Urine: 1.026 (ref 1.005–1.030)

## 2018-02-24 LAB — PREGNANCY, URINE: Preg Test, Ur: NEGATIVE

## 2018-02-24 MED ORDER — CEPHALEXIN 500 MG PO CAPS
500.0000 mg | ORAL_CAPSULE | Freq: Once | ORAL | Status: AC
  Administered 2018-02-24: 500 mg via ORAL
  Filled 2018-02-24: qty 1

## 2018-02-24 MED ORDER — CEPHALEXIN 500 MG PO CAPS: 500.0000 mg | ORAL_CAPSULE | Freq: Four times a day (QID) | ORAL | 0 refills | Status: DC

## 2018-02-24 NOTE — ED Triage Notes (Signed)
Pt reports that since waking up, she had had constant urinary urge and frequency. Denies pain or blood in urine.

## 2018-02-24 NOTE — Discharge Instructions (Signed)
We think that you have a urinary tract infection.  We are starting on antibiotics.  There is some blood in your urine which can be typical of infection.  But it can also be a symptom of kidney stones if you have any pain in the back, or increased symptoms, please return immediately to the emergency department.

## 2018-02-24 NOTE — ED Provider Notes (Signed)
[facility]-EMERGENCY DEPT Provider Note   CSN: [id number] Arrival date & time: 02/24/18  0809     History   Chief Complaint Chief Complaint  Patient presents with  . Urinary Frequency    HPI Jamie Wilkinson is a 37 y.o. female.  HPI   37 year old female presenting with urinary frequency.  Patient reports it started this morning.  No burning.  No back pain.  Patient reports that she frequently has these feelings associated with a bladder infection.  She had no fevers no nausea no vomiting.  No discharge.    Past Medical History:  Diagnosis Date  . Abnormal antibody titer    Positive Antibody KELL  . Asthma    uses rescue inhaler as need-will bring dos  . Blood transfusion    at [facility] 12 years ago   . Crohn's disease (HCC)   . GERD (gastroesophageal reflux disease)    only with pregnancy-uses tums prn  . Kidney stone     Patient Active Problem List   Diagnosis Date Noted  . 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 Status post cesarean section 11/14/2015  . 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 Suspected fetal anomaly, antepartum 08/08/2015  . Asthma affecting pregnancy, antepartum 08/08/2015  . Supervision of high risk multigravida in second trimester in patient 35 years or older at time of delivery 08/08/2015  . Uterine scar from previous cesarean delivery affecting pregnancy 08/08/2015  . Advanced maternal age in multigravida 08/04/2015  . Asthma exacerbation 09/20/2014    Past Surgical History:  Procedure Laterality Date  . CESAREAN SECTION     three previous c/s   . CESAREAN SECTION  10/19/2011   Procedure: CESAREAN SECTION;  Surgeon: Roseanna Rainbow, MD;  Location: [facility];  Service: Gynecology;  Laterality: N/A;  . CESAREAN SECTION N/A 11/14/2015   Procedure: CESAREAN SECTION;  Surgeon: Levie Heritage, DO;  Location: [facility];  Service: Obstetrics;  Laterality: N/A;  . COLONOSCOPY      . LITHOTRIPSY  2005  . NASAL ENDOSCOPY    . WISDOM TOOTH EXTRACTION      OB History    Gravida Para Term Preterm AB Living   5 4 4   1 4    SAB TAB Ectopic Multiple Live Births     1   0 4       Home Medications    Prior to Admission medications   Medication Sig Start Date End Date Taking? Authorizing Provider  albuterol (PROVENTIL HFA;VENTOLIN HFA) 108 (90 Base) MCG/ACT inhaler Inhale 2 puffs into the lungs every 6 (six) hours as needed for wheezing or shortness of breath. Shortness of breath 01/17/16   Aviva Signs, CNM  calcium carbonate (TUMS EX) 750 MG chewable tablet Chew 2 tablets by mouth daily as needed.     [provider]  cephALEXin (KEFLEX) 500 MG capsule Take 1 capsule (500 mg total) by mouth 4 (four) times daily. 02/24/18   Jaycey Gens Lyn, MD  Hyprom-Naphaz-Polysorb-Zn Sulf (CLEAR EYES COMPLETE) SOLN Place 1 drop into both eyes daily as needed (dry eyes).    [provider]  ibuprofen (ADVIL,MOTRIN) 600 MG tablet Take 1 tablet (600 mg total) by mouth every 6 (six) hours as needed for mild pain. Patient not taking: Reported on 01/17/2016 11/17/15   Berton Bon, MD  ipratropium-albuterol (DUONEB) 0.5-2.5 (3) MG/3ML SOLN Take 3 mLs by nebulization every 6 (six) hours as needed. 09/08/15  Cresenzo-Dishmon, Scarlette Calico, CNM  norethindrone (MICRONOR,CAMILA,ERRIN) 0.35 MG tablet Take 1 tablet (0.35 mg total) by mouth daily. 01/17/16   Aviva Signs, CNM  oxyCODONE-acetaminophen (PERCOCET/ROXICET) 5-325 MG tablet Take 1 tablet by mouth every 4 (four) hours as needed (for pain scale 4-7). Patient not taking: Reported on 01/17/2016 11/17/15   Berton Bon, MD  Prenat w/o A Vit-FeFum-FePo-FA (CONCEPT OB) 130-92.4-1 MG CAPS Take 1 tablet by mouth daily. 08/17/15   Dorathy Kinsman, CNM    Family History Family History  Problem Relation Age of Onset  . Cancer Mother   . Diabetes Mother   . Heart disease Maternal Grandfather     Social  History Social History   Tobacco Use  . Smoking status: Former Smoker    Years: 7.00    Types: Cigarettes    Last attempt to quit: 03/16/2011    Years since quitting: 6.9  . Smokeless tobacco: Never Used  Substance Use Topics  . Alcohol use: No  . Drug use: No     Allergies   Remicade [infliximab]   Review of Systems Review of Systems  Constitutional: Negative for activity change.  Respiratory: Negative for shortness of breath.   Cardiovascular: Negative for chest pain.  Gastrointestinal: Negative for abdominal pain.  Genitourinary: Positive for difficulty urinating and frequency. Negative for dysuria, hematuria, vaginal bleeding and vaginal pain.  All other systems reviewed and are negative.    Physical Exam Updated Vital Signs BP 108/78   Pulse 88   Temp 97.9 F (36.6 C) (Oral)   Resp 16   LMP 02/03/2018   SpO2 98%   Physical Exam  Constitutional: She is oriented to person, place, and time. She appears well-developed and well-nourished.  HENT:  Head: Normocephalic and atraumatic.  Eyes: Right eye exhibits no discharge.  Cardiovascular: Normal rate, regular rhythm and normal heart sounds.  No murmur heard. Pulmonary/Chest: Effort normal and breath sounds normal. She has no wheezes. She has no rales.  Abdominal: Soft. She exhibits no distension. There is no tenderness.  Neurological: She is oriented to person, place, and time.  Skin: Skin is warm and dry. She is not diaphoretic.  Psychiatric: She has a normal mood and affect.  Nursing note and vitals reviewed.    ED Treatments / Results  Labs (all labs ordered are listed, but only abnormal results are displayed) Labs Reviewed  URINALYSIS, ROUTINE W REFLEX MICROSCOPIC - Abnormal; Notable for the following components:      Result Value   APPearance CLOUDY (*)    Hgb urine dipstick LARGE (*)    Ketones, ur 5 (*)    Protein, ur 100 (*)    Leukocytes, UA LARGE (*)    Bacteria, UA RARE (*)    Squamous  Epithelial / LPF 6-30 (*)    All other components within normal limits  URINE CULTURE  PREGNANCY, URINE    EKG  EKG Interpretation None       Radiology No results found.  Procedures Procedures (including critical care time)  Medications Ordered in ED Medications  cephALEXin (KEFLEX) capsule 500 mg (500 mg Oral Given 02/24/18 1028)     Initial Impression / Assessment and Plan / ED Course  I have reviewed the triage vital signs and the nursing notes.  Pertinent labs & imaging results that were available during my care of the patient were reviewed by me and considered in my medical decision making (see chart for details).     37 year old female presenting with urinary  frequency.  Patient reports it started this morning.  No burning.  No back pain.  Patient reports that she frequently has these feelings associated with a bladder infection.  She had no fevers no nausea no vomiting.  No discharge. 4:09 PM Very well-appearing 37 year old female with normal vital signs and physical exam presenting with isolated symptom of urinary frequency.  Will test for UTI.  Will treat for UTI, no one sided pain, doubt stone. Will have her follow up and return precuations expressed.   Final Clinical Impressions(s) / ED Diagnoses   Final diagnoses:  Lower urinary tract infectious disease    ED Discharge Orders        Ordered    cephALEXin (KEFLEX) 500 MG capsule  4 times daily     02/24/18 0946       Abelino Derrick, MD 02/25/18 1609

## 2018-02-24 NOTE — ED Notes (Signed)
Bed: [facility] Expected date:  Expected time:  Means of arrival:  Comments: 

## 2018-02-26 ENCOUNTER — Encounter (HOSPITAL_COMMUNITY): Payer: Self-pay | Admitting: Emergency Medicine

## 2018-02-26 ENCOUNTER — Emergency Department (HOSPITAL_COMMUNITY)
Admission: EM | Admit: 2018-02-26 | Discharge: 2018-02-26 | Disposition: A | Discharge status: Home / Self Care | Payer: Self-pay | Attending: Emergency Medicine | Admitting: Emergency Medicine

## 2018-02-26 ENCOUNTER — Other Ambulatory Visit: Payer: Self-pay

## 2018-02-26 DIAGNOSIS — R102 Pelvic and perineal pain: Secondary | ICD-10-CM | POA: Insufficient documentation

## 2018-02-26 DIAGNOSIS — N3001 Acute cystitis with hematuria: Secondary | ICD-10-CM | POA: Insufficient documentation

## 2018-02-26 DIAGNOSIS — Z87891 Personal history of nicotine dependence: Secondary | ICD-10-CM | POA: Insufficient documentation

## 2018-02-26 LAB — URINALYSIS, ROUTINE W REFLEX MICROSCOPIC
Bilirubin Urine: NEGATIVE
GLUCOSE, UA: NEGATIVE mg/dL
Ketones, ur: 5 mg/dL — AB
LEUKOCYTES UA: NEGATIVE
NITRITE: NEGATIVE
PH: 5 (ref 5.0–8.0)
Protein, ur: NEGATIVE mg/dL
SPECIFIC GRAVITY, URINE: 1.021 (ref 1.005–1.030)

## 2018-02-26 LAB — I-STAT BETA HCG BLOOD, ED (MC, WL, AP ONLY): I-stat hCG, quantitative: <5 m[IU]/mL (ref <5)

## 2018-02-26 MED ORDER — KETOROLAC TROMETHAMINE 15 MG/ML IJ SOLN
INTRAMUSCULAR | Status: AC
  Administered 2018-02-26: 15 mg
  Filled 2018-02-26: qty 1

## 2018-02-26 MED ORDER — CEPHALEXIN 500 MG PO CAPS
500.0000 mg | ORAL_CAPSULE | Freq: Once | ORAL | Status: AC
  Administered 2018-02-26: 500 mg via ORAL
  Filled 2018-02-26: qty 1

## 2018-02-26 NOTE — ED Notes (Signed)
Pt requesting to take pain medication from home provider notified.

## 2018-02-26 NOTE — ED Notes (Signed)
Rainbow of blood tubes on hold In lab

## 2018-02-26 NOTE — ED Provider Notes (Signed)
[facility]-EMERGENCY DEPT Provider Note   CSN: [id number] Arrival date & time: 02/26/18  1419     History   Chief Complaint Chief Complaint  Patient presents with  . Urinary Tract Infection    HPI Jamie Wilkinson is a 37 y.o. female with PMH/o Crohns, Kidney stone who presents for evaluation of persistent dysuria, suprapubic pressure.  Patient was seen here on 02/24/18 for dysuria and urinary frequency.  UA revealed at that time that she had a UTI.  Patient was started on Keflex.  Patient reports that she did not pick up the antibiotics until yesterday.  Patient reports that she has started having some suprapubic abdominal discomfort.  She reports that the pain is worsened with attempting to urinate.  Patient also reports that she is still having persistent dysuria.  Patient concerned that her symptoms were not getting better.  Patient states that she has been able to eat and drink without any difficulty.  She has not had any vomiting.  Patient reports that she took 2 doses of the antibiotic yesterday and one dose this morning.  Patient denies any fever, chest pain, difficulty breathing, hematuria, vomiting.  The history is provided by the patient.    Past Medical History:  Diagnosis Date  . Abnormal antibody titer    Positive Antibody KELL  . Asthma    uses rescue inhaler as need-will bring dos  . Blood transfusion    at [facility] 12 years ago   . Crohn's disease (HCC)   . GERD (gastroesophageal reflux disease)    only with pregnancy-uses tums prn  . Kidney stone     Patient Active Problem List   Diagnosis Date Noted  . 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 Status post cesarean section 11/14/2015  . 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 Suspected fetal anomaly, antepartum 08/08/2015  . Asthma affecting pregnancy, antepartum 08/08/2015  . Supervision of high risk multigravida in second trimester in patient  35 years or older at time of delivery 08/08/2015  . Uterine scar from previous cesarean delivery affecting pregnancy 08/08/2015  . Advanced maternal age in multigravida 08/04/2015  . Asthma exacerbation 09/20/2014    Past Surgical History:  Procedure Laterality Date  . CESAREAN SECTION     three previous c/s   . CESAREAN SECTION  10/19/2011   Procedure: CESAREAN SECTION;  Surgeon: Roseanna Rainbow, MD;  Location: [facility];  Service: Gynecology;  Laterality: N/A;  . CESAREAN SECTION N/A 11/14/2015   Procedure: CESAREAN SECTION;  Surgeon: Levie Heritage, DO;  Location: [facility];  Service: Obstetrics;  Laterality: N/A;  . COLONOSCOPY    . LITHOTRIPSY  2005  . NASAL ENDOSCOPY    . WISDOM TOOTH EXTRACTION      OB History    Gravida Para Term Preterm AB Living   5 4 4   1 4    SAB TAB Ectopic Multiple Live Births     1   0 4       Home Medications    Prior to Admission medications   Medication Sig Start Date End Date Taking? Authorizing Provider  albuterol (PROVENTIL HFA;VENTOLIN HFA) 108 (90 Base) MCG/ACT inhaler Inhale 2 puffs into the lungs every 6 (six) hours as needed for wheezing or shortness of breath. Shortness of breath 01/17/16  Yes Aviva Signs, CNM  cephALEXin (KEFLEX) 500 MG capsule Take 1 capsule (500 mg total) by mouth 4 (four) times daily. 02/24/18  Yes  Mackuen, Courteney Lyn, MD  Hyprom-Naphaz-Polysorb-Zn Sulf (CLEAR EYES COMPLETE) SOLN Place 1 drop into both eyes daily as needed (dry eyes).   Yes [provider]  ibuprofen (ADVIL,MOTRIN) 600 MG tablet Take 1 tablet (600 mg total) by mouth every 6 (six) hours as needed for mild pain. 11/17/15  Yes Mikell, Antionette Poles, MD  ipratropium-albuterol (DUONEB) 0.5-2.5 (3) MG/3ML SOLN Take 3 mLs by nebulization every 6 (six) hours as needed. 09/08/15  Yes Cresenzo-Dishmon, Scarlette Calico, CNM  norethindrone (MICRONOR,CAMILA,ERRIN) 0.35 MG tablet Take 1 tablet (0.35 mg total) by mouth daily. Patient not taking: Reported on  02/26/2018 01/17/16   Aviva Signs, CNM  oxyCODONE-acetaminophen (PERCOCET/ROXICET) 5-325 MG tablet Take 1 tablet by mouth every 4 (four) hours as needed (for pain scale 4-7). Patient not taking: Reported on 01/17/2016 11/17/15   Berton Bon, MD  Prenat w/o A Vit-FeFum-FePo-FA (CONCEPT OB) 130-92.4-1 MG CAPS Take 1 tablet by mouth daily. Patient not taking: Reported on 02/26/2018 08/17/15   Dorathy Kinsman, CNM    Family History Family History  Problem Relation Age of Onset  . Cancer Mother   . Diabetes Mother   . Heart disease Maternal Grandfather     Social History Social History   Tobacco Use  . Smoking status: Former Smoker    Years: 7.00    Types: Cigarettes    Last attempt to quit: 03/16/2011    Years since quitting: 6.9  . Smokeless tobacco: Never Used  Substance Use Topics  . Alcohol use: No  . Drug use: No     Allergies   Remicade [infliximab]   Review of Systems Review of Systems  Constitutional: Negative for chills and fever.  HENT: Negative for congestion.   Eyes: Negative for visual disturbance.  Respiratory: Negative for cough and shortness of breath.   Cardiovascular: Negative for chest pain.  Gastrointestinal: Positive for abdominal pain. Negative for diarrhea, nausea and vomiting.  Genitourinary: Positive for dysuria and frequency. Negative for hematuria.  Musculoskeletal: Negative for back pain and neck pain.  Skin: Negative for rash.  Neurological: Negative for dizziness, weakness, numbness and headaches.  Psychiatric/Behavioral: Negative for confusion.     Physical Exam Updated Vital Signs BP (!) 134/97 (BP Location: Right Arm)   Pulse 98   Temp (!) 97.5 F (36.4 C) (Oral)   Resp 18   LMP 02/03/2018   SpO2 100%   Physical Exam  Constitutional: She is oriented to person, place, and time. She appears well-developed and well-nourished.  HENT:  Head: Normocephalic and atraumatic.  Mouth/Throat: Oropharynx is clear and moist and  mucous membranes are normal.  Eyes: Conjunctivae, EOM and lids are normal. Pupils are equal, round, and reactive to light.  Neck: Full passive range of motion without pain.  Cardiovascular: Normal rate, regular rhythm, normal heart sounds and normal pulses. Exam reveals no gallop and no friction rub.  No murmur heard. Pulmonary/Chest: Effort normal and breath sounds normal.  Abdominal: Soft. Normal appearance. There is tenderness in the suprapubic area. There is no rigidity, no guarding and no CVA tenderness.  Abdomen is soft, nondistended.  Mild suprapubic tenderness to palpation.  No CVA tenderness bilaterally.  Musculoskeletal: Normal range of motion.  Neurological: She is alert and oriented to person, place, and time.  Skin: Skin is warm and dry. Capillary refill takes less than 2 seconds.  Psychiatric: She has a normal mood and affect. Her speech is normal.  Nursing note and vitals reviewed.    ED Treatments / Results  Labs (all labs ordered are listed, but only abnormal results are displayed) Labs Reviewed  URINALYSIS, ROUTINE W REFLEX MICROSCOPIC - Abnormal; Notable for the following components:      Result Value   Hgb urine dipstick SMALL (*)    Ketones, ur 5 (*)    Bacteria, UA RARE (*)    Squamous Epithelial / LPF 0-5 (*)    Non Squamous Epithelial 0-5 (*)    All other components within normal limits  URINE CULTURE  I-STAT BETA HCG BLOOD, ED ([facility], [facility], [facility] ONLY)    EKG  EKG Interpretation None       Radiology No results found.  Procedures Procedures (including critical care time)  Medications Ordered in ED Medications  cephALEXin (KEFLEX) capsule 500 mg (not administered)  ketorolac (TORADOL) 15 MG/ML injection (15 mg  Given 02/26/18 2116)     Initial Impression / Assessment and Plan / ED Course  I have reviewed the triage vital signs and the nursing notes.  Pertinent labs & imaging results that were available during my care of the patient were reviewed by  me and considered in my medical decision making (see chart for details).     37 year old female with past medical history of Crohn's, kidney stones who presents for evaluation of persistent dysuria, urinary frequency and suprapubic abdominal discomfort.  Recently seen in the ED on 02/24/18 and diagnosed with UTI.  Was started on Keflex which she did not pick up until yesterday.  Comes in today because she is having some suprapubic abdominal discomfort worse with urination.  Also with some persistent frequency dysuria.  No fevers, vomiting. Patient is afebrile, non-toxic appearing, sitting comfortably on examination table. Vital signs reviewed and stable.  On exam, patient has mild suprapubic tenderness palpation.  No rigidity, guarding.  No CVA tenderness bilaterally.  Consider UTI.  Do not suspect pyelonephritis, kidney stone, appendicitis, diverticulitis, ovarian torsion, given history/physical exam.  Also consider kidney stone.  Will plan to check UA.   UA reviewed.  There is small amount of hemoglobin hemoglobin.  Negative leukocytes, decrease in pyuria.  When compared from a UA done on 02/24/18, looks significantly improved as leukocytes, pyuria have decreased significantly.  I discussed results with patient.  She reports improvement and suprapubic pressure after analgesics here in the department.  Repeat abdominal exam is benign.  No tenderness noted.  No CVA tenderness bilaterally.  I discussed with patient regarding the results.  Engaged in shared decision making with patient.  Patient does have a history of kidney stones but states that this does not feel like her kidney stone pain. I offered to do a CT for evaluation of kidney stone given that patient was having some persistent pain patient declined wanting to have a CT at this time.  Patient reports that she does not want any further workup here in the ED.  Given reassuring vitals, exam, UA, I feel that this is reasonable.  Encourage patient to  continue taking antibiotics for UTI.  Encourage NSAIDs for pain.  Patient instructed to follow-up with [facility] for further evaluation. Patient had ample opportunity for questions and discussion. All patient's questions were answered with full understanding. Strict return precautions discussed. Patient expresses understanding and agreement to plan.    Final Clinical Impressions(s) / ED Diagnoses   Final diagnoses:  Acute cystitis with hematuria    ED Discharge Orders    None       Maxwell Caul, PA-C 02/26/18 2155  Tilden Fossa, MD 02/27/18 1455

## 2018-02-26 NOTE — ED Notes (Signed)
Bed: WA30 Expected date:  Expected time:  Means of arrival:  Comments: 

## 2018-02-26 NOTE — ED Triage Notes (Signed)
Recently diagnosed with UTI taking antibiotics; bladder pain.

## 2018-02-26 NOTE — Discharge Instructions (Signed)
Take antibiotics as directed. Please take all of your antibiotics until finished.  You can take Tylenol or Ibuprofen as directed for pain. You can alternate Tylenol and Ibuprofen every 4 hours. If you take Tylenol at 1pm, then you can take Ibuprofen at 5pm. Then you can take Tylenol again at 9pm.   Return to the Emergency Department for any worsening pain with urination, blood in your urine, fever, persistent vomiting, abdominal pain or any other worsening or concerning symptoms.

## 2018-02-28 LAB — URINE CULTURE

## 2018-03-01 LAB — URINE CULTURE: Culture: >=100000 — AB

## 2018-03-02 ENCOUNTER — Telehealth: Payer: Self-pay | Admitting: *Deleted

## 2018-03-02 NOTE — Telephone Encounter (Signed)
Post ED Visit - Positive Culture Follow-up  Culture report reviewed by antimicrobial stewardship pharmacist:  []  Enzo Bi, Pharm.D. []  Celedonio Miyamoto, Pharm.D., BCPS AQ-ID []  Garvin Fila, Pharm.D., BCPS []  Georgina Pillion, Pharm.D., BCPS []  [place], [address].D., BCPS, AAHIVP []  Estella Husk, Pharm.D., BCPS, AAHIVP []  Lysle Pearl, PharmD, BCPS []  Blake Divine, PharmD []  Pollyann Samples, PharmD, BCPS Ruben Im, PharmD  Positive urine culture Treated with Cephalexin, organism sensitive to the same and no further patient follow-up is required at this time.  Virl [facility] 03/02/2018, 11:20 AM

## 2018-03-14 ENCOUNTER — Encounter (HOSPITAL_COMMUNITY): Payer: Self-pay | Admitting: *Deleted

## 2018-03-14 ENCOUNTER — Emergency Department (HOSPITAL_COMMUNITY)
Admission: EM | Admit: 2018-03-14 | Discharge: 2018-03-14 | Disposition: A | Discharge status: Home / Self Care | Payer: Self-pay | Attending: Emergency Medicine | Admitting: Emergency Medicine

## 2018-03-14 DIAGNOSIS — J45909 Unspecified asthma, uncomplicated: Secondary | ICD-10-CM | POA: Insufficient documentation

## 2018-03-14 DIAGNOSIS — Z79899 Other long term (current) drug therapy: Secondary | ICD-10-CM | POA: Insufficient documentation

## 2018-03-14 DIAGNOSIS — Z87891 Personal history of nicotine dependence: Secondary | ICD-10-CM | POA: Insufficient documentation

## 2018-03-14 DIAGNOSIS — N390 Urinary tract infection, site not specified: Secondary | ICD-10-CM | POA: Insufficient documentation

## 2018-03-14 LAB — URINALYSIS, ROUTINE W REFLEX MICROSCOPIC
BILIRUBIN URINE: NEGATIVE
GLUCOSE, UA: NEGATIVE mg/dL
Ketones, ur: NEGATIVE mg/dL
NITRITE: NEGATIVE
PH: 5 (ref 5.0–8.0)
Protein, ur: 100 mg/dL — AB
SPECIFIC GRAVITY, URINE: 1.017 (ref 1.005–1.030)

## 2018-03-14 MED ORDER — SULFAMETHOXAZOLE-TRIMETHOPRIM 800-160 MG PO TABS: 1.0000 | ORAL_TABLET | Freq: Two times a day (BID) | ORAL | 0 refills | Status: AC

## 2018-03-14 NOTE — ED Triage Notes (Signed)
Pt complains of painful urination, urinary frequency and vaginal pain. Pt was put on kelfex 2 weeks ago for UTI, states she finished the abx but symptoms came back worse. Pt denies vaginal discharge.

## 2018-03-14 NOTE — ED Provider Notes (Signed)
[facility]-EMERGENCY DEPT Provider Note   CSN: [id number] Arrival date & time: 03/14/18  0803     History   Chief Complaint Chief Complaint  Patient presents with  . Dysuria  . Groin Pain    HPI Jamie Wilkinson is a 38 y.o. female.  Patient is a 37 year old female with a history of Crohn's disease, kidney stones, asthma presenting today with recurrent symptoms of dysuria, frequency and urgency.  Patient states she was diagnosed with a urinary tract infection 2 weeks ago.  She took 4 days of antibiotics and felt better until 3 days ago when symptoms returned.  She denies any vaginal bleeding or discharge.  She has no flank pain, nausea, vomiting or fever.  She states it is just an intense discomfort in her pubic area which is much worse when she tries to urinate and only gets a few drops out.  The history is provided by the patient.  Dysuria   This is a recurrent problem. Episode onset: 3 days ago. The problem occurs every urination. The problem has been gradually worsening. The quality of the pain is described as burning and shooting. The pain is at a severity of 4/10. The pain is severe. There has been no fever. She is sexually active. There is no history of pyelonephritis. Associated symptoms include frequency, hesitancy and urgency. Pertinent negatives include no chills, no sweats, no nausea, no vomiting, no discharge, no possible pregnancy and no flank pain. She has tried nothing for the symptoms.  Groin Pain     Past Medical History:  Diagnosis Date  . Abnormal antibody titer    Positive Antibody KELL  . Asthma    uses rescue inhaler as need-will bring dos  . Blood transfusion    at [facility] 12 years ago   . Crohn's disease (HCC)   . GERD (gastroesophageal reflux disease)    only with pregnancy-uses tums prn  . Kidney stone     Patient Active Problem List   Diagnosis Date Noted  . 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 Status post cesarean  section 11/14/2015  . 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 Suspected fetal anomaly, antepartum 08/08/2015  . Asthma affecting pregnancy, antepartum 08/08/2015  . Supervision of high risk multigravida in second trimester in patient 35 years or older at time of delivery 08/08/2015  . Uterine scar from previous cesarean delivery affecting pregnancy 08/08/2015  . Advanced maternal age in multigravida 08/04/2015  . Asthma exacerbation 09/20/2014    Past Surgical History:  Procedure Laterality Date  . CESAREAN SECTION     three previous c/s   . CESAREAN SECTION  10/19/2011   Procedure: CESAREAN SECTION;  Surgeon: Roseanna Rainbow, MD;  Location: [facility];  Service: Gynecology;  Laterality: N/A;  . CESAREAN SECTION N/A 11/14/2015   Procedure: CESAREAN SECTION;  Surgeon: Levie Heritage, DO;  Location: [facility];  Service: Obstetrics;  Laterality: N/A;  . COLONOSCOPY    . LITHOTRIPSY  2005  . NASAL ENDOSCOPY    . WISDOM TOOTH EXTRACTION       OB History    Gravida  5   Para  4   Term  4   Preterm      AB  1   Living  4     SAB      TAB  1   Ectopic      Multiple  0   Live Births  4  Home Medications    Prior to Admission medications   Medication Sig Start Date End Date Taking? Authorizing Provider  acetaminophen (TYLENOL) 500 MG tablet Take 1,000 mg by mouth every 6 (six) hours as needed for mild pain.   Yes [provider]  albuterol (PROVENTIL HFA;VENTOLIN HFA) 108 (90 Base) MCG/ACT inhaler Inhale 2 puffs into the lungs every 6 (six) hours as needed for wheezing or shortness of breath. Shortness of breath 01/17/16  Yes Aviva Signs, CNM  Hyprom-Naphaz-Polysorb-Zn Sulf (CLEAR EYES COMPLETE) SOLN Place 1-2 drops into both eyes daily as needed (dry eyes).    Yes [provider]  cephALEXin (KEFLEX) 500 MG capsule Take 1 capsule (500 mg total) by mouth 4 (four) times daily. Patient not  taking: Reported on 03/14/2018 02/24/18   Abelino Derrick, MD    Family History Family History  Problem Relation Age of Onset  . Cancer Mother   . Diabetes Mother   . Heart disease Maternal Grandfather     Social History Social History   Tobacco Use  . Smoking status: Former Smoker    Years: 7.00    Types: Cigarettes    Last attempt to quit: 03/16/2011    Years since quitting: 7.0  . Smokeless tobacco: Never Used  Substance Use Topics  . Alcohol use: No  . Drug use: No     Allergies   Remicade [infliximab]   Review of Systems Review of Systems  Constitutional: Negative for chills.  Gastrointestinal: Negative for nausea and vomiting.  Genitourinary: Positive for dysuria, frequency, hesitancy and urgency. Negative for flank pain.  All other systems reviewed and are negative.    Physical Exam Updated Vital Signs BP (!) 126/91   Pulse 84   Temp 97.7 F (36.5 C) (Oral)   Resp 15   SpO2 100%   Physical Exam  Constitutional: She is oriented to person, place, and time. She appears well-developed and well-nourished. No distress.  HENT:  Head: Normocephalic and atraumatic.  Mouth/Throat: Oropharynx is clear and moist.  Eyes: Pupils are equal, round, and reactive to light. Conjunctivae and EOM are normal.  Neck: Normal range of motion. Neck supple.  Cardiovascular: Normal rate, regular rhythm and intact distal pulses.  No murmur heard. Pulmonary/Chest: Effort normal and breath sounds normal. No respiratory distress. She has no wheezes. She has no rales.  Abdominal: Soft. She exhibits no distension. There is tenderness in the suprapubic area. There is no rebound, no guarding and no CVA tenderness.  Musculoskeletal: Normal range of motion. She exhibits no edema or tenderness.  Neurological: She is alert and oriented to person, place, and time.  Skin: Skin is warm and dry. No rash noted. No erythema.  Psychiatric: She has a normal mood and affect. Her behavior is  normal.  Nursing note and vitals reviewed.    ED Treatments / Results  Labs (all labs ordered are listed, but only abnormal results are displayed) Labs Reviewed  URINALYSIS, ROUTINE W REFLEX MICROSCOPIC - Abnormal; Notable for the following components:      Result Value   APPearance CLOUDY (*)    Hgb urine dipstick LARGE (*)    Protein, ur 100 (*)    Leukocytes, UA LARGE (*)    Bacteria, UA RARE (*)    Squamous Epithelial / LPF 0-5 (*)    All other components within normal limits  URINE CULTURE    EKG None  Radiology No results found.  Procedures Procedures (including critical care time)  Medications  Ordered in ED Medications - No data to display   Initial Impression / Assessment and Plan / ED Course  I have reviewed the triage vital signs and the nursing notes.  Pertinent labs & imaging results that were available during my care of the patient were reviewed by me and considered in my medical decision making (see chart for details).    Patient presenting with recurrent symptoms of urinary tract infection.  She is not displaying any signs concerning for pyelonephritis and has a prior history of kidney stones 14 years ago but has not had any pain associated with kidney stone type pain and prior to 2 weeks ago had not had a urinary tract infection in years.  She does have a history of Crohn's disease but is not complaining of Crohn's type flare at this time.  She only took 4 days of Keflex and symptoms initially resolved and then returned.  Culture that was done on urine showed multiple species and they recommended recollection.  Urine today is consistent with UTI.  Pregnancy test last week was negative.  Will give a course of Bactrim for 1 week.  Also a new culture was done.  Final Clinical Impressions(s) / ED Diagnoses   Final diagnoses:  Lower urinary tract infectious disease    ED Discharge Orders        Ordered    sulfamethoxazole-trimethoprim (BACTRIM DS,SEPTRA  DS) 800-160 MG tablet  2 times daily     03/14/18 1000       Gwyneth Sprout, MD 03/14/18 1001

## 2018-03-15 LAB — URINE CULTURE

## 2018-10-29 ENCOUNTER — Emergency Department (HOSPITAL_COMMUNITY)
Admission: EM | Admit: 2018-10-29 | Discharge: 2018-10-29 | Disposition: A | Discharge status: Home / Self Care | Payer: No Typology Code available for payment source | Attending: Emergency Medicine | Admitting: Emergency Medicine

## 2018-10-29 ENCOUNTER — Encounter (HOSPITAL_COMMUNITY): Payer: Self-pay | Admitting: *Deleted

## 2018-10-29 DIAGNOSIS — J45909 Unspecified asthma, uncomplicated: Secondary | ICD-10-CM | POA: Insufficient documentation

## 2018-10-29 DIAGNOSIS — M791 Myalgia, unspecified site: Secondary | ICD-10-CM | POA: Diagnosis not present

## 2018-10-29 DIAGNOSIS — Z87891 Personal history of nicotine dependence: Secondary | ICD-10-CM | POA: Diagnosis not present

## 2018-10-29 DIAGNOSIS — R51 Headache: Secondary | ICD-10-CM | POA: Insufficient documentation

## 2018-10-29 DIAGNOSIS — M7918 Myalgia, other site: Secondary | ICD-10-CM
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
## 2018-10-29 NOTE — ED Notes (Signed)
ED Provider at bedside. 
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
## 2018-10-29 NOTE — Discharge Instructions (Addendum)
Please read attached information. If you experience any new or worsening signs or symptoms please return to the emergency room for evaluation. Please follow-up with your primary care provider or specialist as discussed.  °

## 2020-08-02 ENCOUNTER — Ambulatory Visit: Payer: Self-pay | Attending: Internal Medicine

## 2020-08-02 DIAGNOSIS — Z23 Encounter for immunization: Secondary | ICD-10-CM

## 2020-08-02 NOTE — Progress Notes (Signed)
   Covid-19 Vaccination Clinic  Name:  Jamie Wilkinson    MRN: [id number] DOB: Jan 23, 1981  08/02/2020  Jamie Wilkinson was observed post Covid-19 immunization for 30 minutes based on pre-vaccination screening without incident. She was provided with Vaccine Information Sheet and instruction to access the V-Safe system.   Jamie Wilkinson was instructed to call 911 with any severe reactions post vaccine: Marland Kitchen Difficulty breathing  . Swelling of face and throat  . A fast heartbeat  . A bad rash all over body  . Dizziness and weakness   Immunizations Administered    Name Date Dose VIS Date Route   Pfizer COVID-19 Vaccine 08/02/2020  9:45 AM 0.3 mL 02/10/2019 Intramuscular   Manufacturer: [employer], [employer]   Lot: [id number]   NDC: [address]-8

## 2020-08-23 ENCOUNTER — Ambulatory Visit: Payer: No Typology Code available for payment source | Attending: Internal Medicine

## 2020-08-23 DIAGNOSIS — Z23 Encounter for immunization: Secondary | ICD-10-CM

## 2020-08-23 NOTE — Progress Notes (Signed)
   Covid-19 Vaccination Clinic  Name:  Jamie Wilkinson    MRN: [id number] DOB: [birth year]  08/23/2020  [name] was observed post Covid-19 immunization for 15 minutes without incident. She was provided with Vaccine Information Sheet and instruction to access the V-Safe system.   [name] was instructed to call 911 with any severe reactions post vaccine: Marland Kitchen Difficulty breathing  . Swelling of face and throat  . A fast heartbeat  . A bad rash all over body  . Dizziness and weakness   Immunizations Administered    Name Date Dose VIS Date Route   Pfizer COVID-19 Vaccine 08/23/2020  9:28 AM 0.3 mL 02/10/2019 Intramuscular   Manufacturer: [employer], [employer]   Lot: [id number]   NDC: [id number]

## 2023-01-17 DIAGNOSIS — Z419 Encounter for procedure for purposes other than remedying health state, unspecified: Secondary | ICD-10-CM | POA: Diagnosis not present

## 2023-01-25 ENCOUNTER — Ambulatory Visit: Payer: Medicaid Other | Admitting: Student

## 2023-02-07 ENCOUNTER — Ambulatory Visit: Payer: Medicaid Other | Admitting: Student

## 2023-02-15 DIAGNOSIS — Z419 Encounter for procedure for purposes other than remedying health state, unspecified: Secondary | ICD-10-CM | POA: Diagnosis not present

## 2023-03-18 DIAGNOSIS — Z419 Encounter for procedure for purposes other than remedying health state, unspecified: Secondary | ICD-10-CM | POA: Diagnosis not present

## 2023-04-01 ENCOUNTER — Encounter: Payer: Medicaid Other | Admitting: Obstetrics & Gynecology

## 2023-04-17 DIAGNOSIS — Z419 Encounter for procedure for purposes other than remedying health state, unspecified: Secondary | ICD-10-CM | POA: Diagnosis not present

## 2023-05-18 DIAGNOSIS — Z419 Encounter for procedure for purposes other than remedying health state, unspecified: Secondary | ICD-10-CM | POA: Diagnosis not present

## 2023-06-17 DIAGNOSIS — Z419 Encounter for procedure for purposes other than remedying health state, unspecified: Secondary | ICD-10-CM | POA: Diagnosis not present

## 2023-07-18 DIAGNOSIS — Z419 Encounter for procedure for purposes other than remedying health state, unspecified: Secondary | ICD-10-CM | POA: Diagnosis not present

## 2023-08-18 DIAGNOSIS — Z419 Encounter for procedure for purposes other than remedying health state, unspecified: Secondary | ICD-10-CM | POA: Diagnosis not present

## 2023-09-17 DIAGNOSIS — Z419 Encounter for procedure for purposes other than remedying health state, unspecified: Secondary | ICD-10-CM | POA: Diagnosis not present

## 2023-10-18 DIAGNOSIS — Z419 Encounter for procedure for purposes other than remedying health state, unspecified: Secondary | ICD-10-CM | POA: Diagnosis not present

## 2023-11-17 DIAGNOSIS — Z419 Encounter for procedure for purposes other than remedying health state, unspecified: Secondary | ICD-10-CM | POA: Diagnosis not present

## 2023-12-18 DIAGNOSIS — Z419 Encounter for procedure for purposes other than remedying health state, unspecified: Secondary | ICD-10-CM | POA: Diagnosis not present

## 2023-12-22 ENCOUNTER — Other Ambulatory Visit: Payer: Self-pay

## 2023-12-22 ENCOUNTER — Emergency Department (HOSPITAL_COMMUNITY): Payer: Medicaid Other

## 2023-12-22 ENCOUNTER — Emergency Department (HOSPITAL_COMMUNITY)
Admission: EM | Admit: 2023-12-22 | Discharge: 2023-12-22 | Disposition: A | Discharge status: Home / Self Care | Payer: Medicaid Other | Attending: Emergency Medicine | Admitting: Emergency Medicine

## 2023-12-22 ENCOUNTER — Encounter (HOSPITAL_COMMUNITY): Payer: Self-pay

## 2023-12-22 DIAGNOSIS — R109 Unspecified abdominal pain: Secondary | ICD-10-CM | POA: Diagnosis not present

## 2023-12-22 DIAGNOSIS — K429 Umbilical hernia without obstruction or gangrene: Secondary | ICD-10-CM | POA: Diagnosis not present

## 2023-12-22 DIAGNOSIS — R319 Hematuria, unspecified: Secondary | ICD-10-CM | POA: Diagnosis not present

## 2023-12-22 DIAGNOSIS — N838 Other noninflammatory disorders of ovary, fallopian tube and broad ligament: Secondary | ICD-10-CM | POA: Diagnosis not present

## 2023-12-22 LAB — URINALYSIS, ROUTINE W REFLEX MICROSCOPIC
Bacteria, UA: NONE SEEN
Bilirubin Urine: NEGATIVE
Glucose, UA: NEGATIVE mg/dL
Ketones, ur: NEGATIVE mg/dL
Leukocytes,Ua: NEGATIVE
Nitrite: NEGATIVE
Protein, ur: NEGATIVE mg/dL
Specific Gravity, Urine: 1.015 (ref 1.005–1.030)
pH: 6 (ref 5.0–8.0)

## 2023-12-22 LAB — CBC WITH DIFFERENTIAL/PLATELET
Abs Immature Granulocytes: 0.05 10*3/uL (ref 0.00–0.07)
Basophils Absolute: 0.1 10*3/uL (ref 0.0–0.1)
Basophils Relative: 0 %
Eosinophils Absolute: 0.3 10*3/uL (ref 0.0–0.5)
Eosinophils Relative: 3 %
HCT: 43.7 % (ref 36.0–46.0)
Hemoglobin: 15 g/dL (ref 12.0–15.0)
Immature Granulocytes: 0 %
Lymphocytes Relative: 42 %
Lymphs Abs: 5 10*3/uL — ABNORMAL HIGH (ref 0.7–4.0)
MCH: 31.4 pg (ref 26.0–34.0)
MCHC: 34.3 g/dL (ref 30.0–36.0)
MCV: 91.6 fL (ref 80.0–100.0)
Monocytes Absolute: 0.7 10*3/uL (ref 0.1–1.0)
Monocytes Relative: 6 %
Neutro Abs: 5.8 10*3/uL (ref 1.7–7.7)
Neutrophils Relative %: 49 %
Platelets: 274 10*3/uL (ref 150–400)
RBC: 4.77 MIL/uL (ref 3.87–5.11)
RDW: 12.5 % (ref 11.5–15.5)
WBC: 11.9 10*3/uL — ABNORMAL HIGH (ref 4.0–10.5)
nRBC: 0 % (ref 0.0–0.2)

## 2023-12-22 LAB — BASIC METABOLIC PANEL
Anion gap: 9 (ref 5–15)
BUN: 8 mg/dL (ref 6–20)
CO2: 22 mmol/L (ref 22–32)
Calcium: 9.5 mg/dL (ref 8.9–10.3)
Chloride: 107 mmol/L (ref 98–111)
Creatinine, Ser: 0.87 mg/dL (ref 0.44–1.00)
GFR, Estimated: >60 mL/min (ref >60)
Glucose, Bld: 97 mg/dL (ref 70–99)
Potassium: 4.1 mmol/L (ref 3.5–5.1)
Sodium: 138 mmol/L (ref 135–145)

## 2023-12-22 LAB — PREGNANCY, URINE: Preg Test, Ur: NEGATIVE

## 2023-12-22 MED ORDER — SODIUM CHLORIDE 0.9 % IV BOLUS
1000.0000 mL | Freq: Once | INTRAVENOUS | Status: AC
  Administered 2023-12-22: 1000 mL via INTRAVENOUS

## 2023-12-22 MED ORDER — KETOROLAC TROMETHAMINE 30 MG/ML IJ SOLN
15.0000 mg | Freq: Once | INTRAMUSCULAR | Status: AC
  Administered 2023-12-22: 15 mg via INTRAVENOUS
  Filled 2023-12-22: qty 1

## 2023-12-22 MED ORDER — MORPHINE SULFATE (PF) 4 MG/ML IV SOLN
4.0000 mg | Freq: Once | INTRAVENOUS | Status: DC
  Filled 2023-12-22: qty 1

## 2023-12-22 MED ORDER — HYDROCODONE-ACETAMINOPHEN 5-325 MG PO TABS: 1.0000 | ORAL_TABLET | Freq: Four times a day (QID) | ORAL | 0 refills | Status: AC | PRN

## 2023-12-22 MED ORDER — MORPHINE SULFATE (PF) 4 MG/ML IV SOLN
4.0000 mg | Freq: Once | INTRAVENOUS | Status: AC
  Administered 2023-12-22: 4 mg via INTRAVENOUS

## 2023-12-22 MED ORDER — IBUPROFEN 400 MG PO TABS: 400.0000 mg | ORAL_TABLET | Freq: Three times a day (TID) | ORAL | 0 refills | Status: AC

## 2023-12-22 NOTE — Discharge Instructions (Signed)
 Please be sure to follow-up with your physician.  Although today's evaluation has been generally reassuring, if you develop new, or concerning changes in your condition return here.

## 2023-12-22 NOTE — ED Triage Notes (Signed)
 Right sided flank pain that started yesterday with intermittent nausea. Hx of kidney stones and feels the same.

## 2023-12-22 NOTE — ED Provider Notes (Signed)
 [place] EMERGENCY DEPARTMENT AT [facility] Provider Note   CSN: [id number] Arrival date & time: 12/22/23  1858     History  Chief Complaint  Patient presents with   Flank Pain    Jamie Wilkinson is a 43 y.o. female.  HPI Patient presents with concern for right flank pain.  Onset was 48 hours ago.  Since that time pain has been persistent.  She has a history of kidney stones in the distant past, notes this pain is somewhat similar.  There is associated nausea, but no vomiting, no fever.  No relief with Tylenol  that is sustained.    Home Medications Prior to Admission medications   Medication Sig Start Date End Date Taking? Authorizing Provider  HYDROcodone -acetaminophen  (NORCO/VICODIN) 5-325 MG tablet Take 1 tablet by mouth every 6 (six) hours as needed. 12/22/23  Yes Garrick Charleston, MD  ibuprofen  (ADVIL ) 400 MG tablet Take 1 tablet (400 mg total) by mouth 3 (three) times daily for 3 days. Take one tablet three times daily for three days 12/22/23 12/25/23 Yes Garrick Charleston, MD  acetaminophen  (TYLENOL ) 500 MG tablet Take 1,000 mg by mouth every 6 (six) hours as needed for mild pain.    [provider]  albuterol  (PROVENTIL  HFA;VENTOLIN  HFA) 108 (90 Base) MCG/ACT inhaler Inhale 2 puffs into the lungs every 6 (six) hours as needed for wheezing or shortness of breath. Shortness of breath 01/17/16   Trudy Earnie CROME, CNM  Hyprom-Naphaz-Polysorb-Zn Sulf (CLEAR EYES COMPLETE) SOLN Place 1-2 drops into both eyes daily as needed (dry eyes).     [provider]      Allergies    Remicade [infliximab]    Review of Systems   Review of Systems  Physical Exam Updated Vital Signs BP (!) 140/102 (BP Location: Left Arm)   Pulse 93   Temp 98.6 F (37 C) (Oral)   Resp 16   Ht 5' 4 (1.626 m)   Wt 95.3 kg   SpO2 98%   BMI 36.05 kg/m  Physical Exam Vitals and nursing note reviewed.  Constitutional:      General: She is not in acute distress.     Appearance: She is well-developed.  HENT:     Head: Normocephalic and atraumatic.  Eyes:     Conjunctiva/sclera: Conjunctivae normal.  Cardiovascular:     Rate and Rhythm: Normal rate and regular rhythm.  Pulmonary:     Effort: Pulmonary effort is normal. No respiratory distress.     Breath sounds: Normal breath sounds. No stridor.  Abdominal:     General: There is no distension.    Skin:    General: Skin is warm and dry.  Neurological:     Mental Status: She is alert and oriented to person, place, and time.     Cranial Nerves: No cranial nerve deficit.  Psychiatric:        Mood and Affect: Mood normal.     ED Results / Procedures / Treatments   Labs (all labs ordered are listed, but only abnormal results are displayed) Labs Reviewed  URINALYSIS, ROUTINE W REFLEX MICROSCOPIC - Abnormal; Notable for the following components:      Result Value   Hgb urine dipstick MODERATE (*)    All other components within normal limits  CBC WITH DIFFERENTIAL/PLATELET - Abnormal; Notable for the following components:   WBC 11.9 (*)    Lymphs Abs 5.0 (*)    All other components within normal limits  PREGNANCY, URINE  BASIC METABOLIC PANEL    EKG None  Radiology CT Renal Stone Study Result Date: 12/22/2023 CLINICAL DATA:  Right-sided flank pain that started yesterday with intermittent nausea. History of kidney stones. Hematuria. EXAM: CT ABDOMEN AND PELVIS WITHOUT CONTRAST TECHNIQUE: Multidetector CT imaging of the abdomen and pelvis was performed following the standard protocol without IV contrast. RADIATION DOSE REDUCTION: This exam was performed according to the departmental dose-optimization program which includes automated exposure control, adjustment of the mA and/or kV according to patient size and/or use of iterative reconstruction technique. COMPARISON:  None Available. FINDINGS: Lower chest: No acute abnormality. Hepatobiliary: Unremarkable liver. Normal gallbladder. No biliary  dilation. Pancreas: Unremarkable. Spleen: Unremarkable. Adrenals/Urinary Tract: Normal adrenal glands. Nonobstructive punctate right calyceal stone. No obstructing ureteral calculi or hydronephrosis. Bladder is unremarkable. Stomach/Bowel: Normal caliber large and small bowel. No bowel wall thickening. The appendix is normal.Stomach is within normal limits. Vascular/Lymphatic: No significant vascular findings are present. No enlarged abdominal or pelvic lymph nodes. Reproductive: 4.7 cm cystic lesion in the left ovary. No follow-up imaging is recommended. Unremarkable right ovary and uterus. Other: No free intraperitoneal fluid or air. Fat containing periumbilical hernias. Musculoskeletal: No acute fracture. IMPRESSION: 1. No acute abnormality in the abdomen or pelvis. 2. Nonobstructive right nephrolithiasis.  No hydronephrosis. Electronically Signed   By: Norman Gatlin M.D.   On: 12/22/2023 20:40    Procedures Procedures    Medications Ordered in ED Medications  sodium chloride  0.9 % bolus 1,000 mL (1,000 mLs Intravenous New Bag/Given 12/22/23 2056)  ketorolac  (TORADOL ) 30 MG/ML injection 15 mg (15 mg Intravenous Given 12/22/23 2105)  morphine  (PF) 4 MG/ML injection 4 mg (4 mg Intravenous Given 12/22/23 2228)    ED Course/ Medical Decision Making/ A&P                                 Medical Decision Making Well-appearing though uncomfortable appearing adult female presents with right flank pain.  Concern for stone versus UTI versus reproductive organ dysfunction. Patient received Toradol , fluids, monitoring, CT, after initial labs are notable for hematuria. Pulse ox 95% room air normal Cardiac 95 sinus normal   Amount and/or Complexity of Data Reviewed Labs: ordered. Decision-making details documented in ED Course. Radiology: ordered and independent interpretation performed. Decision-making details documented in ED Course.  Risk Prescription drug management. Decision regarding  hospitalization.   10:44 PM Patient awake, alert, in no distress.  I reviewed her labs, CT, labs with mild hematuria, CT without evidence for stone, though there are some suspicion for a passed kidney stone contributing to her symptoms given her right flank pain, history of stones, and otherwise reassuring evaluation.  No evidence for infection, no fever, no hypotension, reassuring urinalysis. Patient will continue analgesia, follow-up with her clinicians as an outpatient.        Final Clinical Impression(s) / ED Diagnoses Final diagnoses:  Right flank pain    Rx / DC Orders ED Discharge Orders          Ordered    ibuprofen  (ADVIL ) 400 MG tablet  3 times daily        12/22/23 2240    HYDROcodone -acetaminophen  (NORCO/VICODIN) 5-325 MG tablet  Every 6 hours PRN        12/22/23 2240              Garrick Charleston, MD 12/22/23 2244

## 2024-01-18 DIAGNOSIS — Z419 Encounter for procedure for purposes other than remedying health state, unspecified: Secondary | ICD-10-CM | POA: Diagnosis not present

## 2024-02-15 DIAGNOSIS — Z419 Encounter for procedure for purposes other than remedying health state, unspecified: Secondary | ICD-10-CM | POA: Diagnosis not present

## 2024-03-28 DIAGNOSIS — Z419 Encounter for procedure for purposes other than remedying health state, unspecified: Secondary | ICD-10-CM | POA: Diagnosis not present

## 2024-04-03 ENCOUNTER — Emergency Department (HOSPITAL_COMMUNITY)
Admission: EM | Admit: 2024-04-03 | Discharge: 2024-04-03 | Disposition: A | Discharge status: Home / Self Care | Attending: Emergency Medicine | Admitting: Emergency Medicine

## 2024-04-03 ENCOUNTER — Other Ambulatory Visit: Payer: Self-pay

## 2024-04-03 ENCOUNTER — Emergency Department (HOSPITAL_COMMUNITY)

## 2024-04-03 ENCOUNTER — Encounter (HOSPITAL_COMMUNITY): Payer: Self-pay | Admitting: *Deleted

## 2024-04-03 DIAGNOSIS — R0602 Shortness of breath: Secondary | ICD-10-CM | POA: Diagnosis not present

## 2024-04-03 DIAGNOSIS — J45901 Unspecified asthma with (acute) exacerbation: Secondary | ICD-10-CM | POA: Insufficient documentation

## 2024-04-03 DIAGNOSIS — R059 Cough, unspecified: Secondary | ICD-10-CM | POA: Diagnosis not present

## 2024-04-03 DIAGNOSIS — R062 Wheezing: Secondary | ICD-10-CM | POA: Diagnosis not present

## 2024-04-03 DIAGNOSIS — J189 Pneumonia, unspecified organism: Secondary | ICD-10-CM | POA: Diagnosis not present

## 2024-04-03 DIAGNOSIS — R918 Other nonspecific abnormal finding of lung field: Secondary | ICD-10-CM | POA: Diagnosis not present

## 2024-04-03 LAB — CBC WITH DIFFERENTIAL/PLATELET
Abs Immature Granulocytes: 0.05 10*3/uL (ref 0.00–0.07)
Basophils Absolute: 0.1 10*3/uL (ref 0.0–0.1)
Basophils Relative: 0 %
Eosinophils Absolute: 0.3 10*3/uL (ref 0.0–0.5)
Eosinophils Relative: 2 %
HCT: 41.2 % (ref 36.0–46.0)
Hemoglobin: 13.8 g/dL (ref 12.0–15.0)
Immature Granulocytes: 0 %
Lymphocytes Relative: 25 %
Lymphs Abs: 3.4 10*3/uL (ref 0.7–4.0)
MCH: 31.1 pg (ref 26.0–34.0)
MCHC: 33.5 g/dL (ref 30.0–36.0)
MCV: 92.8 fL (ref 80.0–100.0)
Monocytes Absolute: 0.9 10*3/uL (ref 0.1–1.0)
Monocytes Relative: 7 %
Neutro Abs: 8.8 10*3/uL — ABNORMAL HIGH (ref 1.7–7.7)
Neutrophils Relative %: 66 %
Platelets: 250 10*3/uL (ref 150–400)
RBC: 4.44 MIL/uL (ref 3.87–5.11)
RDW: 12.6 % (ref 11.5–15.5)
WBC: 13.4 10*3/uL — ABNORMAL HIGH (ref 4.0–10.5)
nRBC: 0 % (ref 0.0–0.2)

## 2024-04-03 LAB — TROPONIN I (HIGH SENSITIVITY): Troponin I (High Sensitivity): 3 ng/L (ref <18)

## 2024-04-03 LAB — COMPREHENSIVE METABOLIC PANEL WITH GFR
ALT: 19 U/L (ref 0–44)
AST: 18 U/L (ref 15–41)
Albumin: 4 g/dL (ref 3.5–5.0)
Alkaline Phosphatase: 60 U/L (ref 38–126)
Anion gap: 10 (ref 5–15)
BUN: 8 mg/dL (ref 6–20)
CO2: 21 mmol/L — ABNORMAL LOW (ref 22–32)
Calcium: 9.2 mg/dL (ref 8.9–10.3)
Chloride: 104 mmol/L (ref 98–111)
Creatinine, Ser: 0.55 mg/dL (ref 0.44–1.00)
GFR, Estimated: >60 mL/min (ref >60)
Glucose, Bld: 125 mg/dL — ABNORMAL HIGH (ref 70–99)
Potassium: 3.5 mmol/L (ref 3.5–5.1)
Sodium: 135 mmol/L (ref 135–145)
Total Bilirubin: 0.5 mg/dL (ref 0.0–1.2)
Total Protein: 7.7 g/dL (ref 6.5–8.1)

## 2024-04-03 LAB — RESP PANEL BY RT-PCR (RSV, FLU A&B, COVID)  RVPGX2
Influenza A by PCR: NEGATIVE
Influenza B by PCR: NEGATIVE
Resp Syncytial Virus by PCR: NEGATIVE
SARS Coronavirus 2 by RT PCR: NEGATIVE

## 2024-04-03 MED ORDER — IPRATROPIUM BROMIDE 0.02 % IN SOLN
1.0000 mg | Freq: Once | RESPIRATORY_TRACT | Status: AC
  Administered 2024-04-03: 1 mg via RESPIRATORY_TRACT
  Filled 2024-04-03: qty 5

## 2024-04-03 MED ORDER — METHYLPREDNISOLONE SODIUM SUCC 125 MG IJ SOLR
125.0000 mg | Freq: Once | INTRAMUSCULAR | Status: AC
  Administered 2024-04-03: 125 mg via INTRAVENOUS
  Filled 2024-04-03: qty 2

## 2024-04-03 MED ORDER — SODIUM CHLORIDE 0.9 % IV BOLUS
1000.0000 mL | Freq: Once | INTRAVENOUS | Status: AC
  Administered 2024-04-03: 1000 mL via INTRAVENOUS

## 2024-04-03 MED ORDER — DOXYCYCLINE HYCLATE 100 MG PO TABS
100.0000 mg | ORAL_TABLET | Freq: Once | ORAL | Status: AC
  Administered 2024-04-03: 100 mg via ORAL
  Filled 2024-04-03: qty 1

## 2024-04-03 MED ORDER — MAGNESIUM SULFATE 2 GM/50ML IV SOLN
2.0000 g | Freq: Once | INTRAVENOUS | Status: AC
  Administered 2024-04-03: 2 g via INTRAVENOUS
  Filled 2024-04-03: qty 50

## 2024-04-03 MED ORDER — AMOXICILLIN-POT CLAVULANATE 875-125 MG PO TABS: 1.0000 | ORAL_TABLET | Freq: Two times a day (BID) | ORAL | 0 refills | Status: AC

## 2024-04-03 MED ORDER — ALBUTEROL SULFATE HFA 108 (90 BASE) MCG/ACT IN AERS: 2.0000 | INHALATION_SPRAY | RESPIRATORY_TRACT | 1 refills | Status: AC | PRN

## 2024-04-03 MED ORDER — PREDNISONE 20 MG PO TABS: ORAL_TABLET | ORAL | 0 refills | Status: AC

## 2024-04-03 MED ORDER — DOXYCYCLINE HYCLATE 100 MG PO CAPS
100.0000 mg | ORAL_CAPSULE | Freq: Two times a day (BID) | ORAL | 0 refills | Status: AC
Start: 2024-04-03 — End: ?

## 2024-04-03 MED ORDER — AMOXICILLIN-POT CLAVULANATE 875-125 MG PO TABS
1.0000 | ORAL_TABLET | Freq: Once | ORAL | Status: AC
  Administered 2024-04-03: 1 via ORAL
  Filled 2024-04-03: qty 1

## 2024-04-03 MED ORDER — ALBUTEROL SULFATE (2.5 MG/3ML) 0.083% IN NEBU
10.0000 mg/h | INHALATION_SOLUTION | Freq: Once | RESPIRATORY_TRACT | Status: AC
  Administered 2024-04-03: 10 mg/h via RESPIRATORY_TRACT
  Filled 2024-04-03: qty 12

## 2024-04-03 NOTE — Discharge Instructions (Signed)
 We are treating you for possible pneumonia with antibiotics.  You were given your first dose here in the emergency department.  You are also given your first dose of steroids and so you need to start the prednisone  tomorrow.    Otherwise, use your inhaler 1-2 puffs every 4 hours for wheezing or shortness of breath.  If you needed significantly more than this, are coughing up blood, have new or worsening shortness of breath, or any other new/concerning symptoms then return to the ER.

## 2024-04-03 NOTE — ED Triage Notes (Signed)
 Wheezing started yesterday with sore throat. Tightness in chest, labored respirations.

## 2024-04-03 NOTE — ED Provider Notes (Signed)
 [place] EMERGENCY DEPARTMENT AT [facility] Provider Note   CSN: [id number] Arrival date & time: 04/03/24  0920     History  Chief Complaint  Patient presents with   Asthma    Jamie Wilkinson is a 43 y.o. female.  HPI 43 year old female with a history of asthma presents with an asthma exacerbation.  She states that a couple days ago she developed mild sore throat and cough and then yesterday developed significant shortness of breath, chest tightness and pressure.  This is similar to prior asthma exacerbations.  She has had some similar relief this bad in the past.  She has not had a fever or leg swelling.  She has been using her inhaler many times without any relief.  Home Medications Prior to Admission medications   Medication Sig Start Date End Date Taking? Authorizing Provider  albuterol  (VENTOLIN  HFA) 108 (90 Base) MCG/ACT inhaler Inhale 2 puffs into the lungs every 4 (four) hours as needed for wheezing or shortness of breath. 04/03/24  Yes Freddi Hamilton, MD  amoxicillin -clavulanate (AUGMENTIN ) 875-125 MG tablet Take 1 tablet by mouth every 12 (twelve) hours. 04/03/24  Yes Freddi Hamilton, MD  doxycycline  (VIBRAMYCIN ) 100 MG capsule Take 1 capsule (100 mg total) by mouth 2 (two) times daily. One po bid x 7 days 04/03/24  Yes Freddi Hamilton, MD  predniSONE  (DELTASONE ) 20 MG tablet 2 tabs po daily x 4 days 04/04/24  Yes Freddi Hamilton, MD  acetaminophen  (TYLENOL ) 500 MG tablet Take 1,000 mg by mouth every 6 (six) hours as needed for mild pain.    [provider]  HYDROcodone -acetaminophen  (NORCO/VICODIN) 5-325 MG tablet Take 1 tablet by mouth every 6 (six) hours as needed. 12/22/23   Garrick Charleston, MD  Hyprom-Naphaz-Polysorb-Zn Sulf (CLEAR EYES COMPLETE) SOLN Place 1-2 drops into both eyes daily as needed (dry eyes).     [provider]      Allergies    Remicade [infliximab]    Review of Systems   Review of Systems  Constitutional:   Negative for fever.  HENT:  Positive for sore throat.   Respiratory:  Positive for cough, chest tightness and shortness of breath.   Cardiovascular:  Positive for chest pain. Negative for leg swelling.  Gastrointestinal:  Negative for vomiting.    Physical Exam Updated Vital Signs BP (!) 151/82   Pulse 92   Temp 98.4 F (36.9 C) (Oral)   Resp (!) 21   Wt 95.3 kg   LMP 03/13/2024   SpO2 96%   BMI 36.05 kg/m  Physical Exam Vitals and nursing note reviewed.  Constitutional:      Appearance: She is well-developed. She is not diaphoretic.  HENT:     Head: Normocephalic and atraumatic.  Cardiovascular:     Rate and Rhythm: Regular rhythm. Tachycardia present.     Heart sounds: Normal heart sounds.  Pulmonary:     Effort: Tachypnea and accessory muscle usage present.     Breath sounds: No stridor. Decreased breath sounds and wheezing present.  Abdominal:     General: There is no distension.  Skin:    General: Skin is warm and dry.  Neurological:     Mental Status: She is alert.     ED Results / Procedures / Treatments   Labs (all labs ordered are listed, but only abnormal results are displayed) Labs Reviewed  CBC WITH DIFFERENTIAL/PLATELET - Abnormal; Notable for the following components:      Result Value   WBC  13.4 (*)    Neutro Abs 8.8 (*)    All other components within normal limits  COMPREHENSIVE METABOLIC PANEL WITH GFR - Abnormal; Notable for the following components:   CO2 21 (*)    Glucose, Bld 125 (*)    All other components within normal limits  RESP PANEL BY RT-PCR (RSV, FLU A&B, COVID)  RVPGX2  HCG, SERUM, QUALITATIVE  TROPONIN I (HIGH SENSITIVITY)    EKG EKG Interpretation Date/Time:  Friday April 03 2024 09:46:00 EDT Ventricular Rate:  98 PR Interval:  148 QRS Duration:  96 QT Interval:  367 QTC Calculation: 469 R Axis:   77  Text Interpretation: Sinus rhythm no acute ST/T changes Confirmed by Freddi Hamilton [id number]) on 04/03/2024 9:50:15  AM  Radiology DG Chest Portable 1 View Result Date: 04/03/2024 CLINICAL DATA:  Cough and wheezing. EXAM: PORTABLE CHEST 1 VIEW COMPARISON:  Chest radiograph dated 09/14/2014. FINDINGS: The heart size and mediastinal contours are within normal limits. Hazy interstitial opacity at the bilateral lung bases, could reflect overlying soft tissue or infectious/inflammatory process. No pleural effusion or pneumothorax. No acute osseous abnormality. IMPRESSION: Hazy interstitial opacity at the bilateral lung bases, could reflect overlying soft tissue or an infectious/inflammatory process. Consider short-term follow-up with PA and lateral radiographs. Electronically Signed   By: Harrietta Sherry M.D.   On: 04/03/2024 11:26    Procedures .Critical Care  Performed by: Freddi Hamilton, MD Authorized by: Freddi Hamilton, MD   Critical care provider statement:    Critical care time (minutes):  30   Critical care time was exclusive of:  Separately billable procedures and treating other patients   Critical care was necessary to treat or prevent imminent or life-threatening deterioration of the following conditions:  Respiratory failure   Critical care was time spent personally by me on the following activities:  Development of treatment plan with patient or surrogate, discussions with consultants, evaluation of patient's response to treatment, examination of patient, ordering and review of laboratory studies, ordering and review of radiographic studies, ordering and performing treatments and interventions, pulse oximetry, re-evaluation of patient's condition and review of old charts     Medications Ordered in ED Medications  methylPREDNISolone  sodium succinate (SOLU-MEDROL ) 125 mg/2 mL injection 125 mg (125 mg Intravenous Given 04/03/24 0952)  albuterol  (PROVENTIL ) (2.5 MG/3ML) 0.083% nebulizer solution (10 mg/hr Nebulization Given 04/03/24 0937)  ipratropium (ATROVENT ) nebulizer solution 1 mg (1 mg  Nebulization Given 04/03/24 9062)  magnesium  sulfate IVPB 2 g 50 mL (0 g Intravenous Stopped 04/03/24 1104)  sodium chloride  0.9 % bolus 1,000 mL (0 mLs Intravenous Stopped 04/03/24 1105)  amoxicillin -clavulanate (AUGMENTIN ) 875-125 MG per tablet 1 tablet (1 tablet Oral Given 04/03/24 1226)  doxycycline  (VIBRA -TABS) tablet 100 mg (100 mg Oral Given 04/03/24 1226)    ED Course/ Medical Decision Making/ A&P                                 Medical Decision Making Amount and/or Complexity of Data Reviewed Labs: ordered.    Details: Leukocytosis. Radiology: ordered and independent interpretation performed.    Details: Hazy opacities concerning for pneumonia ECG/medicine tests: ordered and independent interpretation performed.    Details: Sinus rhythm  Risk Prescription drug management.   Patient presents with significant asthma exacerbation.  She is having significant increased work of breathing and was given a continuous hour-long breathing treatment, Solu-Medrol , and magnesium .  She was also given a bolus of  fluids.  She is feeling a lot better and ultimately was able to ambulate to the bathroom and feels well enough for discharge.  I did offer we could consider overnight observation but she declines.  She does have an x-ray concerning for pneumonia which she states the symptoms have been similar to in the past and so she will be treated with Augmentin  and doxycycline .  Will also give a burst of steroids.  Will refill her inhaler as she is running low on her current inhaler.  Otherwise, give return precautions but she feels discharge.  Lung sounds have improved substantially.        Final Clinical Impression(s) / ED Diagnoses Final diagnoses:  Exacerbation of asthma, unspecified asthma severity, unspecified whether persistent  Community acquired pneumonia, unspecified laterality    Rx / DC Orders ED Discharge Orders          Ordered    predniSONE  (DELTASONE ) 20 MG tablet         04/03/24 1212    amoxicillin -clavulanate (AUGMENTIN ) 875-125 MG tablet  Every 12 hours        04/03/24 1212    doxycycline  (VIBRAMYCIN ) 100 MG capsule  2 times daily        04/03/24 1212    albuterol  (VENTOLIN  HFA) 108 (90 Base) MCG/ACT inhaler  Every 4 hours PRN        04/03/24 1212              Freddi Hamilton, MD 04/03/24 1231

## 2024-04-27 DIAGNOSIS — Z419 Encounter for procedure for purposes other than remedying health state, unspecified: Secondary | ICD-10-CM | POA: Diagnosis not present

## 2024-05-28 DIAGNOSIS — Z419 Encounter for procedure for purposes other than remedying health state, unspecified: Secondary | ICD-10-CM | POA: Diagnosis not present

## 2024-06-27 DIAGNOSIS — Z419 Encounter for procedure for purposes other than remedying health state, unspecified: Secondary | ICD-10-CM | POA: Diagnosis not present

## 2024-07-28 DIAGNOSIS — Z419 Encounter for procedure for purposes other than remedying health state, unspecified: Secondary | ICD-10-CM | POA: Diagnosis not present

## 2024-08-28 DIAGNOSIS — Z419 Encounter for procedure for purposes other than remedying health state, unspecified: Secondary | ICD-10-CM | POA: Diagnosis not present

## 2024-11-27 DIAGNOSIS — Z419 Encounter for procedure for purposes other than remedying health state, unspecified: Secondary | ICD-10-CM | POA: Diagnosis not present
